# Patient Record
Sex: Female | Born: 1960 | Race: White | Hispanic: No
Health system: Southern US, Community
[De-identification: ages and names within clinical notes are randomized; demographics above are authoritative.]

## PROBLEM LIST (undated history)

## (undated) DIAGNOSIS — J449 Chronic obstructive pulmonary disease, unspecified: Secondary | ICD-10-CM

## (undated) DIAGNOSIS — J189 Pneumonia, unspecified organism: Secondary | ICD-10-CM

## (undated) DIAGNOSIS — R6520 Severe sepsis without septic shock: Secondary | ICD-10-CM

## (undated) DIAGNOSIS — J90 Pleural effusion, not elsewhere classified: Secondary | ICD-10-CM

## (undated) DIAGNOSIS — A419 Sepsis, unspecified organism: Secondary | ICD-10-CM

## (undated) DIAGNOSIS — J9621 Acute and chronic respiratory failure with hypoxia: Secondary | ICD-10-CM

---

## 2020-05-05 ENCOUNTER — Other Ambulatory Visit (HOSPITAL_COMMUNITY): Payer: Medicare Other

## 2020-05-05 ENCOUNTER — Inpatient Hospital Stay
Admission: RE | Admit: 2020-05-05 | Discharge: 2020-05-27 | Disposition: A | Discharge status: Skilled Nursing Facility | Payer: Medicare Other | Attending: Internal Medicine | Admitting: Internal Medicine

## 2020-05-05 DIAGNOSIS — J449 Chronic obstructive pulmonary disease, unspecified: Secondary | ICD-10-CM | POA: Diagnosis present

## 2020-05-05 DIAGNOSIS — A419 Sepsis, unspecified organism: Secondary | ICD-10-CM | POA: Diagnosis present

## 2020-05-05 DIAGNOSIS — J9600 Acute respiratory failure, unspecified whether with hypoxia or hypercapnia: Secondary | ICD-10-CM

## 2020-05-05 DIAGNOSIS — Z4659 Encounter for fitting and adjustment of other gastrointestinal appliance and device: Secondary | ICD-10-CM

## 2020-05-05 DIAGNOSIS — Z9889 Other specified postprocedural states: Secondary | ICD-10-CM

## 2020-05-05 DIAGNOSIS — J90 Pleural effusion, not elsewhere classified: Secondary | ICD-10-CM | POA: Diagnosis present

## 2020-05-05 DIAGNOSIS — J9621 Acute and chronic respiratory failure with hypoxia: Secondary | ICD-10-CM | POA: Diagnosis present

## 2020-05-05 DIAGNOSIS — N179 Acute kidney failure, unspecified: Secondary | ICD-10-CM

## 2020-05-05 DIAGNOSIS — J189 Pneumonia, unspecified organism: Secondary | ICD-10-CM

## 2020-05-05 HISTORY — DX: Sepsis, unspecified organism: A41.9

## 2020-05-05 HISTORY — DX: Chronic obstructive pulmonary disease, unspecified: J44.9

## 2020-05-05 HISTORY — DX: Acute and chronic respiratory failure with hypoxia: J96.21

## 2020-05-05 HISTORY — DX: Pneumonia, unspecified organism: J18.9

## 2020-05-05 HISTORY — DX: Severe sepsis without septic shock: R65.20

## 2020-05-05 HISTORY — DX: Pleural effusion, not elsewhere classified: J90

## 2020-05-05 LAB — BLOOD GAS, ARTERIAL
Acid-Base Excess: 5.4 mmol/L — ABNORMAL HIGH (ref 0.0–2.0)
Bicarbonate: 29.2 mmol/L — ABNORMAL HIGH (ref 20.0–28.0)
FIO2: 80
O2 Saturation: 90.5 %
Patient temperature: 36
pCO2 arterial: 39.6 mmHg (ref 32.0–48.0)
pH, Arterial: 7.476 — ABNORMAL HIGH (ref 7.350–7.450)
pO2, Arterial: 56.6 mmHg — ABNORMAL LOW (ref 83.0–108.0)

## 2020-05-05 LAB — VANCOMYCIN, TROUGH: Vancomycin Tr: 12 ug/mL — ABNORMAL LOW (ref 15–20)

## 2020-05-06 ENCOUNTER — Other Ambulatory Visit: Payer: Self-pay

## 2020-05-06 ENCOUNTER — Other Ambulatory Visit (HOSPITAL_COMMUNITY): Payer: Medicare Other

## 2020-05-06 HISTORY — PX: IR THORACENTESIS ASP PLEURAL SPACE W/IMG GUIDE: IMG5380

## 2020-05-06 LAB — BODY FLUID CELL COUNT WITH DIFFERENTIAL
Eos, Fluid: 0 %
Lymphs, Fluid: 40 %
Monocyte-Macrophage-Serous Fluid: 11 % — ABNORMAL LOW (ref 50–90)
Neutrophil Count, Fluid: 49 % — ABNORMAL HIGH (ref 0–25)
Total Nucleated Cell Count, Fluid: 105 cu mm (ref 0–1000)

## 2020-05-06 LAB — CBC WITH DIFFERENTIAL/PLATELET
Abs Immature Granulocytes: 0.11 10*3/uL — ABNORMAL HIGH (ref 0.00–0.07)
Basophils Absolute: 0 10*3/uL (ref 0.0–0.1)
Basophils Relative: 0 %
Eosinophils Absolute: 0.1 10*3/uL (ref 0.0–0.5)
Eosinophils Relative: 1 %
HCT: 24.4 % — ABNORMAL LOW (ref 36.0–46.0)
Hemoglobin: 7.4 g/dL — ABNORMAL LOW (ref 12.0–15.0)
Immature Granulocytes: 1 %
Lymphocytes Relative: 9 %
Lymphs Abs: 0.7 10*3/uL (ref 0.7–4.0)
MCH: 26.6 pg (ref 26.0–34.0)
MCHC: 30.3 g/dL (ref 30.0–36.0)
MCV: 87.8 fL (ref 80.0–100.0)
Monocytes Absolute: 0.4 10*3/uL (ref 0.1–1.0)
Monocytes Relative: 5 %
Neutro Abs: 6.9 10*3/uL (ref 1.7–7.7)
Neutrophils Relative %: 84 %
Platelets: 94 10*3/uL — ABNORMAL LOW (ref 150–400)
RBC: 2.78 MIL/uL — ABNORMAL LOW (ref 3.87–5.11)
RDW: 21.2 % — ABNORMAL HIGH (ref 11.5–15.5)
WBC: 8.2 10*3/uL (ref 4.0–10.5)
nRBC: 0 % (ref 0.0–0.2)

## 2020-05-06 LAB — COMPREHENSIVE METABOLIC PANEL
ALT: 106 U/L — ABNORMAL HIGH (ref 0–44)
AST: 75 U/L — ABNORMAL HIGH (ref 15–41)
Albumin: 1.6 g/dL — ABNORMAL LOW (ref 3.5–5.0)
Alkaline Phosphatase: 85 U/L (ref 38–126)
Anion gap: 10 (ref 5–15)
BUN: 74 mg/dL — ABNORMAL HIGH (ref 8–23)
CO2: 29 mmol/L (ref 22–32)
Calcium: 7.8 mg/dL — ABNORMAL LOW (ref 8.9–10.3)
Chloride: 112 mmol/L — ABNORMAL HIGH (ref 98–111)
Creatinine, Ser: 1.3 mg/dL — ABNORMAL HIGH (ref 0.44–1.00)
GFR calc Af Amer: 51 mL/min — ABNORMAL LOW (ref >60)
GFR calc non Af Amer: 44 mL/min — ABNORMAL LOW (ref >60)
Glucose, Bld: 96 mg/dL (ref 70–99)
Potassium: 3.7 mmol/L (ref 3.5–5.1)
Sodium: 151 mmol/L — ABNORMAL HIGH (ref 135–145)
Total Bilirubin: 0.7 mg/dL (ref 0.3–1.2)
Total Protein: 4.7 g/dL — ABNORMAL LOW (ref 6.5–8.1)

## 2020-05-06 LAB — GLUCOSE, PLEURAL OR PERITONEAL FLUID: Glucose, Fluid: 110 mg/dL

## 2020-05-06 LAB — LACTATE DEHYDROGENASE, PLEURAL OR PERITONEAL FLUID: LD, Fluid: 109 U/L — ABNORMAL HIGH (ref 3–23)

## 2020-05-06 LAB — PROTIME-INR
INR: 1.1 (ref 0.8–1.2)
Prothrombin Time: 13.9 seconds (ref 11.4–15.2)

## 2020-05-06 LAB — GRAM STAIN

## 2020-05-06 LAB — HEMOGLOBIN A1C
Hgb A1c MFr Bld: 6.6 % — ABNORMAL HIGH (ref 4.8–5.6)
Mean Plasma Glucose: 142.72 mg/dL

## 2020-05-06 LAB — PHOSPHORUS: Phosphorus: 4.9 mg/dL — ABNORMAL HIGH (ref 2.5–4.6)

## 2020-05-06 LAB — PROTEIN, PLEURAL OR PERITONEAL FLUID: Total protein, fluid: <3 g/dL

## 2020-05-06 LAB — MAGNESIUM: Magnesium: 2.2 mg/dL (ref 1.7–2.4)

## 2020-05-06 MED ORDER — LIDOCAINE HCL 1 % IJ SOLN
INTRAMUSCULAR | Status: AC
  Filled 2020-05-06: qty 20

## 2020-05-06 NOTE — Procedures (Signed)
PROCEDURE SUMMARY:  Successful US guided right thoracentesis. Yielded 550 mL of clear yellow fluid. Patient tolerated procedure well. No immediate complications. EBL = trace  Specimen was sent for labs.  Post procedure chest X-ray pending  Mariko Nowakowski S Mackensey Bolte PA-C 05/06/2020 11:54 AM

## 2020-05-07 LAB — BASIC METABOLIC PANEL
Anion gap: 12 (ref 5–15)
BUN: 62 mg/dL — ABNORMAL HIGH (ref 8–23)
CO2: 27 mmol/L (ref 22–32)
Calcium: 7.8 mg/dL — ABNORMAL LOW (ref 8.9–10.3)
Chloride: 112 mmol/L — ABNORMAL HIGH (ref 98–111)
Creatinine, Ser: 1.27 mg/dL — ABNORMAL HIGH (ref 0.44–1.00)
GFR calc Af Amer: 53 mL/min — ABNORMAL LOW (ref >60)
GFR calc non Af Amer: 46 mL/min — ABNORMAL LOW (ref >60)
Glucose, Bld: 135 mg/dL — ABNORMAL HIGH (ref 70–99)
Potassium: 3.8 mmol/L (ref 3.5–5.1)
Sodium: 151 mmol/L — ABNORMAL HIGH (ref 135–145)

## 2020-05-07 LAB — PH, BODY FLUID: pH, Body Fluid: 7.9

## 2020-05-08 DIAGNOSIS — J9621 Acute and chronic respiratory failure with hypoxia: Secondary | ICD-10-CM

## 2020-05-08 DIAGNOSIS — A419 Sepsis, unspecified organism: Secondary | ICD-10-CM

## 2020-05-08 DIAGNOSIS — J90 Pleural effusion, not elsewhere classified: Secondary | ICD-10-CM | POA: Diagnosis not present

## 2020-05-08 DIAGNOSIS — R6520 Severe sepsis without septic shock: Secondary | ICD-10-CM

## 2020-05-08 DIAGNOSIS — J449 Chronic obstructive pulmonary disease, unspecified: Secondary | ICD-10-CM

## 2020-05-08 DIAGNOSIS — J189 Pneumonia, unspecified organism: Secondary | ICD-10-CM

## 2020-05-08 LAB — CBC
HCT: 21.2 % — ABNORMAL LOW (ref 36.0–46.0)
Hemoglobin: 6.3 g/dL — CL (ref 12.0–15.0)
MCH: 26.5 pg (ref 26.0–34.0)
MCHC: 29.7 g/dL — ABNORMAL LOW (ref 30.0–36.0)
MCV: 89.1 fL (ref 80.0–100.0)
Platelets: 82 10*3/uL — ABNORMAL LOW (ref 150–400)
RBC: 2.38 MIL/uL — ABNORMAL LOW (ref 3.87–5.11)
RDW: 21.5 % — ABNORMAL HIGH (ref 11.5–15.5)
WBC: 6.8 10*3/uL (ref 4.0–10.5)
nRBC: 0 % (ref 0.0–0.2)

## 2020-05-08 LAB — BLOOD GAS, ARTERIAL
Acid-Base Excess: 5.6 mmol/L — ABNORMAL HIGH (ref 0.0–2.0)
Bicarbonate: 28.9 mmol/L — ABNORMAL HIGH (ref 20.0–28.0)
FIO2: 75
O2 Saturation: 97.1 %
Patient temperature: 37.3
pCO2 arterial: 37.6 mmHg (ref 32.0–48.0)
pH, Arterial: 7.499 — ABNORMAL HIGH (ref 7.350–7.450)
pO2, Arterial: 82.8 mmHg — ABNORMAL LOW (ref 83.0–108.0)

## 2020-05-08 LAB — BASIC METABOLIC PANEL
Anion gap: 11 (ref 5–15)
BUN: 59 mg/dL — ABNORMAL HIGH (ref 8–23)
CO2: 27 mmol/L (ref 22–32)
Calcium: 8 mg/dL — ABNORMAL LOW (ref 8.9–10.3)
Chloride: 110 mmol/L (ref 98–111)
Creatinine, Ser: 1.39 mg/dL — ABNORMAL HIGH (ref 0.44–1.00)
GFR calc Af Amer: 47 mL/min — ABNORMAL LOW (ref >60)
GFR calc non Af Amer: 41 mL/min — ABNORMAL LOW (ref >60)
Glucose, Bld: 300 mg/dL — ABNORMAL HIGH (ref 70–99)
Potassium: 2.9 mmol/L — ABNORMAL LOW (ref 3.5–5.1)
Sodium: 148 mmol/L — ABNORMAL HIGH (ref 135–145)

## 2020-05-08 LAB — MAGNESIUM: Magnesium: 2.2 mg/dL (ref 1.7–2.4)

## 2020-05-08 LAB — ABO/RH: ABO/RH(D): AB POS

## 2020-05-08 LAB — PREPARE RBC (CROSSMATCH)

## 2020-05-08 NOTE — Consult Note (Signed)
Pulmonary Critical Care Medicine [facility] [place]  PULMONARY SERVICE  Date of Service: 05/08/2020  PULMONARY CRITICAL CARE Katherine Hampton  CXK:[id number]  DOB: 1961/08/24   DOA: 05/05/2020  Referring Physician: Carron Curie, MD  HPI: Katherine Hampton is a 59 y.o. female seen for follow up of Acute on Chronic Respiratory Failure.  Patient has multiple medical problems presented to the hospital with nausea and vomiting patient also was noted to be febrile at that time.  Diagnosed with sepsis secondary to osteomyelitis.  Patient however decompensated developed respiratory failure also developed multifocal pneumonia ended up on BiPAP and she was essentially BiPAP dependent for 3 weeks.  The patient was found to have MRSA bacteremia and was treated with IV antibiotics and is supposed to continue with the antibiotics through 13 August.  Other complications included development of acute renal failure secondary to fluid issues.  Patient also has been somewhat encephalopathic also been having delirium.  She currently is on high flow oxygen has been on 15 L with an FiO2 of 55%.  Review of Systems:  ROS performed and is unremarkable other than noted above.  Past Medical History:  Diagnosis Date  . Anxiety  . Arthritis  rheumatoid  . Chronic back pain  goes to pain clinic  . COPD (chronic obstructive pulmonary disease) (*)  . Diabetes mellitus (*)  . Hypertension  . Rheumatoid arthritis(714.0) (*)   Past Surgical History:  Procedure Laterality Date  . Carpal tunnel release  . Cholecystectomy  . Foot surgery  . Heel spur excision  . Spine surgery  lumbar  . Toe amputation 10/14/2016  . Tonsillectomy   Social History   Socioeconomic History  . Marital status: Widowed  Spouse name: Not on file  . Number of children: Not on file  . Years of education: Not on file  . Highest education level: Not on file  Occupational History  . Not on file  Tobacco Use  . Smoking  status: Current Every Day Smoker  Packs/day: 0.50  Types: Cigarettes  . Smokeless tobacco: Never Used   Family History  Problem Relation Age of Onset  . Heart disease Father  . Diabetes Father   Allergies  Allergen Reactions  . Cashew Anaphylaxis  . Other Itching and Cough  PET DANDER  . Penicillins Anaphylaxis  . Morphine Nausea And Vomiting  . Sulfa Antibiotics Nausea And Vomiting    Medications: Reviewed on Rounds  Physical Exam:  Vitals: Temperature is 97.1 pulse 78 respiratory 34 blood pressure is 145/76 saturations 93%  Ventilator Settings on heated high flow oxygen  . General: Comfortable at this time . Eyes: Grossly normal lids, irises & conjunctiva . ENT: grossly tongue is normal . Neck: no obvious mass . Cardiovascular: S1-S2 normal no gallop rub . Respiratory: No rhonchi very coarse breath sounds . Abdomen: Soft and nontender . Skin: no rash seen on limited exam . Musculoskeletal: not rigid . Psychiatric:unable to assess . Neurologic: no seizure no involuntary movements         Labs on Admission:  Basic Metabolic Panel: Recent Labs  Lab 05/06/20 0635 05/07/20 0549 05/08/20 1215  NA 151* 151* 148*  K 3.7 3.8 2.9*  CL 112* 112* 110  CO2 29 27 27   GLUCOSE 96 135* 300*  BUN 74* 62* 59*  CREATININE 1.30* 1.27* 1.39*  CALCIUM 7.8* 7.8* 8.0*  MG 2.2  --  2.2  PHOS 4.9*  --   --     Recent Labs  Lab 05/05/20 1738 05/08/20 0951  PHART 7.476* 7.499*  PCO2ART 39.6 37.6  PO2ART 56.6* 82.8*  HCO3 29.2* 28.9*  O2SAT 90.5 97.1    Liver Function Tests: Recent Labs  Lab 05/06/20 0635  AST 75*  ALT 106*  ALKPHOS 85  BILITOT 0.7  PROT 4.7*  ALBUMIN 1.6*   No results for input(s): LIPASE, AMYLASE in the last 168 hours. No results for input(s): AMMONIA in the last 168 hours.  CBC: Recent Labs  Lab 05/06/20 0635 05/08/20 1215  WBC 8.2 6.8  NEUTROABS 6.9  --   HGB 7.4* 6.3*  HCT 24.4* 21.2*  MCV 87.8 89.1  PLT 94* 82*    Cardiac  Enzymes: No results for input(s): CKTOTAL, CKMB, CKMBINDEX, TROPONINI in the last 168 hours.  BNP (last 3 results) No results for input(s): BNP in the last 8760 hours.  ProBNP (last 3 results) No results for input(s): PROBNP in the last 8760 hours.   Radiological Exams on Admission: DG Chest 1 View  Result Date: 05/06/2020 CLINICAL DATA:  Status post right thoracentesis. EXAM: CHEST  1 VIEW COMPARISON:  05/06/2020. FINDINGS: NG tube noted with tip below left hemidiaphragm. Stable cardiomegaly. No pulmonary venous congestion. Low lung volumes with bibasilar atelectasis/infiltrates. Small left pleural effusion. No pneumothorax. IMPRESSION: 1. NG tube noted with tip below left hemidiaphragm. 2. Cardiomegaly. No pulmonary venous congestion. 3. Low lung volumes with bibasilar atelectasis/infiltrates. Small left pleural effusion. No pneumothorax. Electronically Signed   By: Maisie Fus  Register   On: 05/06/2020 12:35   DG CHEST PORT 1 VIEW  Result Date: 05/06/2020 CLINICAL DATA:  Respiratory failure EXAM: PORTABLE CHEST 1 VIEW COMPARISON:  None. FINDINGS: Central catheter tip is in the superior vena cava. Nasogastric tube tip and side port are below the diaphragm. No pneumothorax. There is an apparent right pleural effusion. There is atelectatic change in the right base, primarily medially. The left lung is clear. There is cardiomegaly with pulmonary vascularity normal. No adenopathy. No bone lesions. IMPRESSION: Tube and catheter positions as described without pneumothorax. Right pleural effusion with right base atelectasis. A degree of pneumonia in the right base superimposed is difficult to exclude. Left lung appears clear. There is cardiomegaly. Electronically Signed   By: Bretta Bang III M.D.   On: 05/06/2020 05:53   DG Abd Portable 1V  Result Date: 05/05/2020 CLINICAL DATA:  NG tube placement. EXAM: PORTABLE ABDOMEN - 1 VIEW COMPARISON:  None. FINDINGS: Tip and side port of the enteric tube  below the diaphragm in the stomach. No bowel dilatation in the visualized abdomen. Opacification of lower right hemithorax is likely related to elevated right hemidiaphragm. IMPRESSION: Tip and side port of the enteric tube below the diaphragm in the stomach. Electronically Signed   By: Narda Rutherford M.D.   On: 05/05/2020 18:08   IR THORACENTESIS ASP PLEURAL SPACE W/IMG GUIDE  Result Date: 05/06/2020 INDICATION: Fluid overload right pleural effusion. Request for diagnostic and therapeutic thoracentesis. EXAM: ULTRASOUND GUIDED RIGHT THORACENTESIS MEDICATIONS: 1% lidocaine 10 mL COMPLICATIONS: None immediate. PROCEDURE: An ultrasound guided thoracentesis was thoroughly discussed with the patient and questions answered. The benefits, risks, alternatives and complications were also discussed. The patient understands and wishes to proceed with the procedure. Written consent was obtained. Ultrasound was performed to localize and mark an adequate pocket of fluid in the right chest. The area was then prepped and draped in the normal sterile fashion. 1% Lidocaine was used for local anesthesia. Under ultrasound guidance a 6 Fr Safe-T-Centesis catheter was  introduced. Thoracentesis was performed. The catheter was removed and a dressing applied. FINDINGS: A total of approximately 550 mL of clear light yellow fluid was removed. Samples were sent to the laboratory as requested by the clinical team. IMPRESSION: Successful ultrasound guided right thoracentesis yielding 550 mL of pleural fluid. No pneumothorax on post-procedure chest x-ray. Read by: Corrin Parker, PA-C Electronically Signed   By: Gilmer Mor D.O.   On: 05/06/2020 13:56    Assessment/Plan Active Problems:   Acute on chronic respiratory failure with hypoxia (HCC)   COPD, severe (HCC)   Multifocal pneumonia   Severe sepsis (HCC)   Pleural effusion   1. Acute on chronic respiratory failure hypoxia patient right now is still on heated high flow  oxygen.  Patient has been on higher levels however had a thoracentesis done on the 29th with removal of 550 cc of fluid will see about weaning FiO2 down further if possible.  We will continue with supportive care follow-up x-ray revealed no pneumothorax 2. Severe COPD patient will continue with medical management nebulizers as necessary. 3. Multifocal pneumonia patient has been treated with antibiotics at been cultured with MRSA bacteremia also. 4. Severe sepsis has been treated with antibiotics supposed to complete antibiotics on August 13 is felt to be secondary to the osteomyelitis 5. Pleural effusion status post thoracentesis had removal of 550 cc of fluid we will continue to monitor for reoccurrence.  I have personally seen and evaluated the patient, evaluated laboratory and imaging results, formulated the assessment and plan and placed orders. The Patient requires high complexity decision making with multiple systems involvement.  Case was discussed on Rounds with the Respiratory Therapy Director and the Respiratory staff Time Spent  Yevonne Pax, MD [facility] Pulmonary Critical Care Medicine Sleep Medicine

## 2020-05-09 ENCOUNTER — Other Ambulatory Visit (HOSPITAL_COMMUNITY): Payer: Medicare Other

## 2020-05-09 DIAGNOSIS — J449 Chronic obstructive pulmonary disease, unspecified: Secondary | ICD-10-CM | POA: Diagnosis not present

## 2020-05-09 DIAGNOSIS — J9621 Acute and chronic respiratory failure with hypoxia: Secondary | ICD-10-CM | POA: Diagnosis not present

## 2020-05-09 DIAGNOSIS — J189 Pneumonia, unspecified organism: Secondary | ICD-10-CM | POA: Diagnosis not present

## 2020-05-09 DIAGNOSIS — J90 Pleural effusion, not elsewhere classified: Secondary | ICD-10-CM | POA: Diagnosis not present

## 2020-05-09 LAB — CBC
HCT: 24.5 % — ABNORMAL LOW (ref 36.0–46.0)
Hemoglobin: 7.5 g/dL — ABNORMAL LOW (ref 12.0–15.0)
MCH: 27.2 pg (ref 26.0–34.0)
MCHC: 30.6 g/dL (ref 30.0–36.0)
MCV: 88.8 fL (ref 80.0–100.0)
Platelets: 79 10*3/uL — ABNORMAL LOW (ref 150–400)
RBC: 2.76 MIL/uL — ABNORMAL LOW (ref 3.87–5.11)
RDW: 21.2 % — ABNORMAL HIGH (ref 11.5–15.5)
WBC: 9.1 10*3/uL (ref 4.0–10.5)
nRBC: 0.3 % — ABNORMAL HIGH (ref 0.0–0.2)

## 2020-05-09 LAB — BASIC METABOLIC PANEL
Anion gap: 12 (ref 5–15)
BUN: 57 mg/dL — ABNORMAL HIGH (ref 8–23)
CO2: 27 mmol/L (ref 22–32)
Calcium: 8.4 mg/dL — ABNORMAL LOW (ref 8.9–10.3)
Chloride: 110 mmol/L (ref 98–111)
Creatinine, Ser: 1.45 mg/dL — ABNORMAL HIGH (ref 0.44–1.00)
GFR calc Af Amer: 45 mL/min — ABNORMAL LOW (ref >60)
GFR calc non Af Amer: 39 mL/min — ABNORMAL LOW (ref >60)
Glucose, Bld: 229 mg/dL — ABNORMAL HIGH (ref 70–99)
Potassium: 3.3 mmol/L — ABNORMAL LOW (ref 3.5–5.1)
Sodium: 149 mmol/L — ABNORMAL HIGH (ref 135–145)

## 2020-05-09 NOTE — Progress Notes (Signed)
Pulmonary Critical Care Medicine [facility] [place]   PULMONARY CRITICAL CARE SERVICE  PROGRESS NOTE  Date of Service: 05/09/2020  Katherine Hampton  [id number]  DOB: [birth year]   DOA: 05/05/2020  Referring Physician: Carron Curie, MD  HPI: Katherine Hampton is a 59 y.o. female seen for follow up of Acute on Chronic Respiratory Failure.  Patient at this time is on high flow nasal cannula has been on 20 L right now requiring 55% FiO2 good saturations are noted.  Medications: Reviewed on Rounds  Physical Exam:  Vitals: Temperature is 98.4 pulse 72 respiratory 25 blood pressure is 138/67 saturations 95%  Ventilator Settings on high flow nasal cannula 20 L with an FiO2 of 55%  . General: Comfortable at this time . Eyes: Grossly normal lids, irises & conjunctiva . ENT: grossly tongue is normal . Neck: no obvious mass . Cardiovascular: S1 S2 normal no gallop . Respiratory: Scattered rhonchi coarse breath sounds are noted at this time . Abdomen: soft . Skin: no rash seen on limited exam . Musculoskeletal: not rigid . Psychiatric:unable to assess . Neurologic: no seizure no involuntary movements         Lab Data:   Basic Metabolic Panel: Recent Labs  Lab 05/06/20 0635 05/07/20 0549 05/08/20 1215 05/09/20 0800  NA 151* 151* 148* 149*  K 3.7 3.8 2.9* 3.3*  CL 112* 112* 110 110  CO2 29 27 27 27   GLUCOSE 96 135* 300* 229*  BUN 74* 62* 59* 57*  CREATININE 1.30* 1.27* 1.39* 1.45*  CALCIUM 7.8* 7.8* 8.0* 8.4*  MG 2.2  --  2.2  --   PHOS 4.9*  --   --   --     ABG: Recent Labs  Lab 05/05/20 1738 05/08/20 0951  PHART 7.476* 7.499*  PCO2ART 39.6 37.6  PO2ART 56.6* 82.8*  HCO3 29.2* 28.9*  O2SAT 90.5 97.1    Liver Function Tests: Recent Labs  Lab 05/06/20 0635  AST 75*  ALT 106*  ALKPHOS 85  BILITOT 0.7  PROT 4.7*  ALBUMIN 1.6*   No results for input(s): LIPASE, AMYLASE in the last 168 hours. No results for input(s): AMMONIA in the last 168  hours.  CBC: Recent Labs  Lab 05/06/20 0635 05/08/20 1215 05/09/20 0800  WBC 8.2 6.8 9.1  NEUTROABS 6.9  --   --   HGB 7.4* 6.3* 7.5*  HCT 24.4* 21.2* 24.5*  MCV 87.8 89.1 88.8  PLT 94* 82* 79*    Cardiac Enzymes: No results for input(s): CKTOTAL, CKMB, CKMBINDEX, TROPONINI in the last 168 hours.  BNP (last 3 results) No results for input(s): BNP in the last 8760 hours.  ProBNP (last 3 results) No results for input(s): PROBNP in the last 8760 hours.  Radiological Exams: No results found.  Assessment/Plan Active Problems:   Acute on chronic respiratory failure with hypoxia (HCC)   COPD, severe (HCC)   Multifocal pneumonia   Severe sepsis (HCC)   Pleural effusion   1. Acute on chronic respiratory failure hypoxia patient continues on high flow oxygen right now flow rates are down FiO2 is down spoke with respiratory therapy during rounds to try to wean the FiO2 down and try an Oxymizer possibly.  Patient's hemoglobin looks better than it was yesterday so perhaps this will make it easier to try to wean FiO2. 2. Severe COPD medical management we will continue with supportive care. 3. Multifocal pneumonia antibiotics as ordered. 4. Severe sepsis hemodynamics are stable 5. Pleural effusion status post thoracentesis  I have personally seen and evaluated the patient, evaluated laboratory and imaging results, formulated the assessment and plan and placed orders. The Patient requires high complexity decision making with multiple systems involvement.  Rounds were done with the Respiratory Therapy Director and Staff therapists and discussed with nursing staff also.  Allyne Gee, MD [facility] Pulmonary Critical Care Medicine Sleep Medicine

## 2020-05-10 ENCOUNTER — Encounter: Payer: Self-pay | Admitting: Internal Medicine

## 2020-05-10 DIAGNOSIS — J449 Chronic obstructive pulmonary disease, unspecified: Secondary | ICD-10-CM | POA: Diagnosis not present

## 2020-05-10 DIAGNOSIS — J189 Pneumonia, unspecified organism: Secondary | ICD-10-CM | POA: Diagnosis not present

## 2020-05-10 DIAGNOSIS — J9621 Acute and chronic respiratory failure with hypoxia: Secondary | ICD-10-CM | POA: Diagnosis present

## 2020-05-10 DIAGNOSIS — A419 Sepsis, unspecified organism: Secondary | ICD-10-CM | POA: Diagnosis present

## 2020-05-10 DIAGNOSIS — J90 Pleural effusion, not elsewhere classified: Secondary | ICD-10-CM | POA: Diagnosis present

## 2020-05-10 LAB — BASIC METABOLIC PANEL
Anion gap: 10 (ref 5–15)
BUN: 50 mg/dL — ABNORMAL HIGH (ref 8–23)
CO2: 26 mmol/L (ref 22–32)
Calcium: 7.7 mg/dL — ABNORMAL LOW (ref 8.9–10.3)
Chloride: 105 mmol/L (ref 98–111)
Creatinine, Ser: 1.27 mg/dL — ABNORMAL HIGH (ref 0.44–1.00)
GFR calc Af Amer: 53 mL/min — ABNORMAL LOW (ref >60)
GFR calc non Af Amer: 46 mL/min — ABNORMAL LOW (ref >60)
Glucose, Bld: 343 mg/dL — ABNORMAL HIGH (ref 70–99)
Potassium: 3.4 mmol/L — ABNORMAL LOW (ref 3.5–5.1)
Sodium: 141 mmol/L (ref 135–145)

## 2020-05-10 LAB — RENAL FUNCTION PANEL
Albumin: 2 g/dL — ABNORMAL LOW (ref 3.5–5.0)
Anion gap: 11 (ref 5–15)
BUN: 56 mg/dL — ABNORMAL HIGH (ref 8–23)
CO2: 26 mmol/L (ref 22–32)
Calcium: 8 mg/dL — ABNORMAL LOW (ref 8.9–10.3)
Chloride: 109 mmol/L (ref 98–111)
Creatinine, Ser: 1.33 mg/dL — ABNORMAL HIGH (ref 0.44–1.00)
GFR calc Af Amer: 50 mL/min — ABNORMAL LOW (ref >60)
GFR calc non Af Amer: 43 mL/min — ABNORMAL LOW (ref >60)
Glucose, Bld: 186 mg/dL — ABNORMAL HIGH (ref 70–99)
Phosphorus: 3.4 mg/dL (ref 2.5–4.6)
Potassium: 4.2 mmol/L (ref 3.5–5.1)
Sodium: 146 mmol/L — ABNORMAL HIGH (ref 135–145)

## 2020-05-10 LAB — VANCOMYCIN, TROUGH: Vancomycin Tr: 30 ug/mL (ref 15–20)

## 2020-05-10 LAB — CYTOLOGY - NON PAP

## 2020-05-10 NOTE — Progress Notes (Signed)
Pulmonary Critical Care Medicine [facility] [place]   PULMONARY CRITICAL CARE SERVICE  PROGRESS NOTE  Date of Service: 05/10/2020  Katherine Hampton  WNU:[id number]  DOB: 04/27/1961   DOA: 05/05/2020  Referring Physician: Carron Curie, MD  HPI: Katherine Hampton is a 59 y.o. female seen for follow up of Acute on Chronic Respiratory Failure.  Patient is on high flow nasal cannula right now is on 15 L FiO2 is at 55% we will try to decrease the FiO2 down.  Secretions are minimal right now  Medications: Reviewed on Rounds  Physical Exam:  Vitals: Temperature is 98.1 pulse 66 respiratory 23 blood pressure is 126/59 saturations 96%  Ventilator Settings on high flow nasal cannula 15 L FiO2 55%  . General: Comfortable at this time . Eyes: Grossly normal lids, irises & conjunctiva . ENT: grossly tongue is normal . Neck: no obvious mass . Cardiovascular: S1 S2 normal no gallop . Respiratory: No rhonchi coarse breath sounds . Abdomen: soft . Skin: no rash seen on limited exam . Musculoskeletal: not rigid . Psychiatric:unable to assess . Neurologic: no seizure no involuntary movements         Lab Data:   Basic Metabolic Panel: Recent Labs  Lab 05/06/20 0635 05/07/20 0549 05/08/20 1215 05/09/20 0800 05/10/20 0437  NA 151* 151* 148* 149* 146*  K 3.7 3.8 2.9* 3.3* 4.2  CL 112* 112* 110 110 109  CO2 29 27 27 27 26   GLUCOSE 96 135* 300* 229* 186*  BUN 74* 62* 59* 57* 56*  CREATININE 1.30* 1.27* 1.39* 1.45* 1.33*  CALCIUM 7.8* 7.8* 8.0* 8.4* 8.0*  MG 2.2  --  2.2  --   --   PHOS 4.9*  --   --   --  3.4    ABG: Recent Labs  Lab 05/05/20 1738 05/08/20 0951  PHART 7.476* 7.499*  PCO2ART 39.6 37.6  PO2ART 56.6* 82.8*  HCO3 29.2* 28.9*  O2SAT 90.5 97.1    Liver Function Tests: Recent Labs  Lab 05/06/20 0635 05/10/20 0437  AST 75*  --   ALT 106*  --   ALKPHOS 85  --   BILITOT 0.7  --   PROT 4.7*  --   ALBUMIN 1.6* 2.0*   No results for input(s): LIPASE, AMYLASE  in the last 168 hours. No results for input(s): AMMONIA in the last 168 hours.  CBC: Recent Labs  Lab 05/06/20 0635 05/08/20 1215 05/09/20 0800  WBC 8.2 6.8 9.1  NEUTROABS 6.9  --   --   HGB 7.4* 6.3* 7.5*  HCT 24.4* 21.2* 24.5*  MCV 87.8 89.1 88.8  PLT 94* 82* 79*    Cardiac Enzymes: No results for input(s): CKTOTAL, CKMB, CKMBINDEX, TROPONINI in the last 168 hours.  BNP (last 3 results) No results for input(s): BNP in the last 8760 hours.  ProBNP (last 3 results) No results for input(s): PROBNP in the last 8760 hours.  Radiological Exams: 07/09/20 RENAL  Result Date: 05/09/2020 CLINICAL DATA:  Acute kidney injury EXAM: RENAL / URINARY TRACT ULTRASOUND COMPLETE COMPARISON:  None. FINDINGS: Right Kidney: Renal measurements: Not visualized Left Kidney: Renal measurements: 10.2 x 5.7 x 5.9 cm = volume: 178 mL. Echogenicity within normal limits. No mass or hydronephrosis visualized. Bladder: Decompressed by Foley catheter. Other: Small volume ascites within RIGHT lower quadrant, LEFT lower quadrant and midline pelvis. IMPRESSION: 1. LEFT kidney appears normal. 2. RIGHT kidney is not visualized. 3. Small volume ascites. 4. Bladder decompressed by Foley catheter. Electronically Signed  By: Bary Richard M.D.   On: 05/09/2020 13:32    Assessment/Plan Active Problems:   Acute on chronic respiratory failure with hypoxia (HCC)   COPD, severe (HCC)   Multifocal pneumonia   Severe sepsis (HCC)   Pleural effusion   1. Acute on chronic respiratory failure hypoxia patient right now will be weaned down FiO2.  Patient will continue with secretion management supportive care. 2. Multifocal pneumonia treated with antibiotics we will continue to follow 3. Severe COPD medical management nebulizers as necessary 4. Severe sepsis hemodynamics are stable 5. Pleural effusion status post thoracentesis   I have personally seen and evaluated the patient, evaluated laboratory and imaging results,  formulated the assessment and plan and placed orders. The Patient requires high complexity decision making with multiple systems involvement.  Rounds were done with the Respiratory Therapy Director and Staff therapists and discussed with nursing staff also.  Yevonne Pax, MD [facility] Pulmonary Critical Care Medicine Sleep Medicine

## 2020-05-11 ENCOUNTER — Other Ambulatory Visit (HOSPITAL_COMMUNITY): Payer: Medicare Other

## 2020-05-11 DIAGNOSIS — J9621 Acute and chronic respiratory failure with hypoxia: Secondary | ICD-10-CM | POA: Diagnosis not present

## 2020-05-11 DIAGNOSIS — J90 Pleural effusion, not elsewhere classified: Secondary | ICD-10-CM | POA: Diagnosis not present

## 2020-05-11 DIAGNOSIS — J449 Chronic obstructive pulmonary disease, unspecified: Secondary | ICD-10-CM | POA: Diagnosis not present

## 2020-05-11 DIAGNOSIS — J189 Pneumonia, unspecified organism: Secondary | ICD-10-CM | POA: Diagnosis not present

## 2020-05-11 LAB — CULTURE, BLOOD (ROUTINE X 2)
Culture: NO GROWTH
Culture: NO GROWTH
Special Requests: ADEQUATE

## 2020-05-11 LAB — RENAL FUNCTION PANEL
Albumin: 1.8 g/dL — ABNORMAL LOW (ref 3.5–5.0)
Anion gap: 7 (ref 5–15)
BUN: 52 mg/dL — ABNORMAL HIGH (ref 8–23)
CO2: 28 mmol/L (ref 22–32)
Calcium: 7.9 mg/dL — ABNORMAL LOW (ref 8.9–10.3)
Chloride: 108 mmol/L (ref 98–111)
Creatinine, Ser: 1.21 mg/dL — ABNORMAL HIGH (ref 0.44–1.00)
GFR calc Af Amer: 56 mL/min — ABNORMAL LOW (ref >60)
GFR calc non Af Amer: 48 mL/min — ABNORMAL LOW (ref >60)
Glucose, Bld: 179 mg/dL — ABNORMAL HIGH (ref 70–99)
Phosphorus: 3.7 mg/dL (ref 2.5–4.6)
Potassium: 3.3 mmol/L — ABNORMAL LOW (ref 3.5–5.1)
Sodium: 143 mmol/L (ref 135–145)

## 2020-05-11 LAB — TYPE AND SCREEN
ABO/RH(D): AB POS
Antibody Screen: NEGATIVE

## 2020-05-11 LAB — CBC
HCT: 22.5 % — ABNORMAL LOW (ref 36.0–46.0)
Hemoglobin: 6.8 g/dL — CL (ref 12.0–15.0)
MCH: 27.3 pg (ref 26.0–34.0)
MCHC: 30.2 g/dL (ref 30.0–36.0)
MCV: 90.4 fL (ref 80.0–100.0)
Platelets: 55 10*3/uL — ABNORMAL LOW (ref 150–400)
RBC: 2.49 MIL/uL — ABNORMAL LOW (ref 3.87–5.11)
RDW: 21 % — ABNORMAL HIGH (ref 11.5–15.5)
WBC: 6.4 10*3/uL (ref 4.0–10.5)
nRBC: 0 % (ref 0.0–0.2)

## 2020-05-11 LAB — PREPARE RBC (CROSSMATCH)

## 2020-05-11 LAB — VANCOMYCIN, TROUGH
Vancomycin Tr: 32 ug/mL (ref 15–20)
Vancomycin Tr: 29 ug/mL (ref 15–20)

## 2020-05-11 LAB — MAGNESIUM: Magnesium: 1.9 mg/dL (ref 1.7–2.4)

## 2020-05-11 NOTE — Progress Notes (Signed)
Pulmonary Critical Care Medicine [facility] [place]   PULMONARY CRITICAL CARE SERVICE  PROGRESS NOTE  Date of Service: 05/11/2020  Katherine Hampton  RRN:[id number]  DOB: Mar 24, 1961   DOA: 05/05/2020  Referring Physician: Carron Curie, MD  HPI: Katherine Hampton is a 59 y.o. female seen for follow up of Acute on Chronic Respiratory Failure.  Patient is down to 6 L of Oxymizer today off of the high flow  Medications: Reviewed on Rounds  Physical Exam:  Vitals: Temperature is 97.6 pulse 68 respiratory 22 blood pressure is 118/54 saturations 99%  Ventilator Settings on 6 L Oxymizer  . General: Comfortable at this time . Eyes: Grossly normal lids, irises & conjunctiva . ENT: grossly tongue is normal . Neck: no obvious mass . Cardiovascular: S1 S2 normal no gallop . Respiratory: No rhonchi no rales are noted . Abdomen: soft . Skin: no rash seen on limited exam . Musculoskeletal: not rigid . Psychiatric:unable to assess . Neurologic: no seizure no involuntary movements         Lab Data:   Basic Metabolic Panel: Recent Labs  Lab 05/06/20 0635 05/07/20 0549 05/08/20 1215 05/09/20 0800 05/10/20 0437 05/10/20 1714 05/11/20 0630  NA 151*   < > 148* 149* 146* 141 143  K 3.7   < > 2.9* 3.3* 4.2 3.4* 3.3*  CL 112*   < > 110 110 109 105 108  CO2 29   < > 27 27 26 26 28   GLUCOSE 96   < > 300* 229* 186* 343* 179*  BUN 74*   < > 59* 57* 56* 50* 52*  CREATININE 1.30*   < > 1.39* 1.45* 1.33* 1.27* 1.21*  CALCIUM 7.8*   < > 8.0* 8.4* 8.0* 7.7* 7.9*  MG 2.2  --  2.2  --   --   --  1.9  PHOS 4.9*  --   --   --  3.4  --  3.7   < > = values in this interval not displayed.    ABG: Recent Labs  Lab 05/05/20 1738 05/08/20 0951  PHART 7.476* 7.499*  PCO2ART 39.6 37.6  PO2ART 56.6* 82.8*  HCO3 29.2* 28.9*  O2SAT 90.5 97.1    Liver Function Tests: Recent Labs  Lab 05/06/20 0635 05/10/20 0437 05/11/20 0630  AST 75*  --   --   ALT 106*  --   --   ALKPHOS 85  --   --    BILITOT 0.7  --   --   PROT 4.7*  --   --   ALBUMIN 1.6* 2.0* 1.8*   No results for input(s): LIPASE, AMYLASE in the last 168 hours. No results for input(s): AMMONIA in the last 168 hours.  CBC: Recent Labs  Lab 05/06/20 0635 05/08/20 1215 05/09/20 0800 05/11/20 0630  WBC 8.2 6.8 9.1 6.4  NEUTROABS 6.9  --   --   --   HGB 7.4* 6.3* 7.5* 6.8*  HCT 24.4* 21.2* 24.5* 22.5*  MCV 87.8 89.1 88.8 90.4  PLT 94* 82* 79* 55*    Cardiac Enzymes: No results for input(s): CKTOTAL, CKMB, CKMBINDEX, TROPONINI in the last 168 hours.  BNP (last 3 results) No results for input(s): BNP in the last 8760 hours.  ProBNP (last 3 results) No results for input(s): PROBNP in the last 8760 hours.  Radiological Exams: 07/11/20 RENAL  Result Date: 05/09/2020 CLINICAL DATA:  Acute kidney injury EXAM: RENAL / URINARY TRACT ULTRASOUND COMPLETE COMPARISON:  None. FINDINGS: Right Kidney: Renal  measurements: Not visualized Left Kidney: Renal measurements: 10.2 x 5.7 x 5.9 cm = volume: 178 mL. Echogenicity within normal limits. No mass or hydronephrosis visualized. Bladder: Decompressed by Foley catheter. Other: Small volume ascites within RIGHT lower quadrant, LEFT lower quadrant and midline pelvis. IMPRESSION: 1. LEFT kidney appears normal. 2. RIGHT kidney is not visualized. 3. Small volume ascites. 4. Bladder decompressed by Foley catheter. Electronically Signed   By: Bary Richard M.D.   On: 05/09/2020 13:32   DG CHEST PORT 1 VIEW  Result Date: 05/11/2020 CLINICAL DATA:  Pneumonia.  Pleural effusion EXAM: PORTABLE CHEST 1 VIEW COMPARISON:  05/06/2020 FINDINGS: Cardiac enlargement. Shallow inspiration. Infiltrates in the lung bases, greater on the right. Probable right pleural effusion. Infiltrates are improved since prior study. Right PICC line and enteric tube are unchanged in position. IMPRESSION: Improving bilateral basilar infiltrates and right pleural effusion. Electronically Signed   By: Burman Nieves M.D.    On: 05/11/2020 06:31    Assessment/Plan Active Problems:   Acute on chronic respiratory failure with hypoxia (HCC)   COPD, severe (HCC)   Multifocal pneumonia   Severe sepsis (HCC)   Pleural effusion   1. Acute on chronic respiratory failure hypoxia try to continue to wean FiO2 down as tolerated 2. Severe COPD medical management 3. Multifocal pneumonia treated improving 4. Severe sepsis resolved 5. Pleural effusion no change we will continue to follow   I have personally seen and evaluated the patient, evaluated laboratory and imaging results, formulated the assessment and plan and placed orders. The Patient requires high complexity decision making with multiple systems involvement.  Rounds were done with the Respiratory Therapy Director and Staff therapists and discussed with nursing staff also.  Yevonne Pax, MD [facility] Pulmonary Critical Care Medicine Sleep Medicine

## 2020-05-12 DIAGNOSIS — J90 Pleural effusion, not elsewhere classified: Secondary | ICD-10-CM | POA: Diagnosis not present

## 2020-05-12 DIAGNOSIS — J9621 Acute and chronic respiratory failure with hypoxia: Secondary | ICD-10-CM | POA: Diagnosis not present

## 2020-05-12 DIAGNOSIS — J449 Chronic obstructive pulmonary disease, unspecified: Secondary | ICD-10-CM | POA: Diagnosis not present

## 2020-05-12 DIAGNOSIS — J189 Pneumonia, unspecified organism: Secondary | ICD-10-CM | POA: Diagnosis not present

## 2020-05-12 LAB — BPAM RBC
Blood Product Expiration Date: 202108072359
Blood Product Expiration Date: 202108242359
ISSUE DATE / TIME: 202107312338
ISSUE DATE / TIME: 202108031200
Unit Type and Rh: 1700
Unit Type and Rh: 8400

## 2020-05-12 LAB — RENAL FUNCTION PANEL
Albumin: 1.8 g/dL — ABNORMAL LOW (ref 3.5–5.0)
Anion gap: 7 (ref 5–15)
BUN: 52 mg/dL — ABNORMAL HIGH (ref 8–23)
CO2: 27 mmol/L (ref 22–32)
Calcium: 7.8 mg/dL — ABNORMAL LOW (ref 8.9–10.3)
Chloride: 108 mmol/L (ref 98–111)
Creatinine, Ser: 1.28 mg/dL — ABNORMAL HIGH (ref 0.44–1.00)
GFR calc Af Amer: 52 mL/min — ABNORMAL LOW (ref >60)
GFR calc non Af Amer: 45 mL/min — ABNORMAL LOW (ref >60)
Glucose, Bld: 264 mg/dL — ABNORMAL HIGH (ref 70–99)
Phosphorus: 4.1 mg/dL (ref 2.5–4.6)
Potassium: 3.8 mmol/L (ref 3.5–5.1)
Sodium: 142 mmol/L (ref 135–145)

## 2020-05-12 LAB — CBC
HCT: 25.8 % — ABNORMAL LOW (ref 36.0–46.0)
Hemoglobin: 8.2 g/dL — ABNORMAL LOW (ref 12.0–15.0)
MCH: 28.5 pg (ref 26.0–34.0)
MCHC: 31.8 g/dL (ref 30.0–36.0)
MCV: 89.6 fL (ref 80.0–100.0)
Platelets: 53 10*3/uL — ABNORMAL LOW (ref 150–400)
RBC: 2.88 MIL/uL — ABNORMAL LOW (ref 3.87–5.11)
RDW: 19.4 % — ABNORMAL HIGH (ref 11.5–15.5)
WBC: 7.1 10*3/uL (ref 4.0–10.5)
nRBC: 0 % (ref 0.0–0.2)

## 2020-05-12 LAB — TYPE AND SCREEN
ABO/RH(D): AB POS
Antibody Screen: NEGATIVE
Unit division: 0
Unit division: 0

## 2020-05-12 LAB — MAGNESIUM: Magnesium: 1.9 mg/dL (ref 1.7–2.4)

## 2020-05-12 NOTE — Progress Notes (Signed)
Pulmonary Critical Care Medicine [facility] [place]   PULMONARY CRITICAL CARE SERVICE  PROGRESS NOTE  Date of Service: 05/12/2020  Katherine Hampton  UUE:[id number]  DOB: April 16, 1961   DOA: 05/05/2020  Referring Physician: Carron Curie, MD  HPI: Katherine Hampton is a 59 y.o. female seen for follow up of Acute on Chronic Respiratory Failure.  Patient is on 3 L oxygen doing fairly well at this time.  Saturations are maintained  Medications: Reviewed on Rounds  Physical Exam:  Vitals: Temperature 98.2 pulse 62 respiratory 21 blood pressure is 107/83 saturations 96%  Ventilator Settings on 3 L nasal cannula  . General: Comfortable at this time . Eyes: Grossly normal lids, irises & conjunctiva . ENT: grossly tongue is normal . Neck: no obvious mass . Cardiovascular: S1 S2 normal no gallop . Respiratory: No rhonchi very coarse breath sounds . Abdomen: soft . Skin: no rash seen on limited exam . Musculoskeletal: not rigid . Psychiatric:unable to assess . Neurologic: no seizure no involuntary movements         Lab Data:   Basic Metabolic Panel: Recent Labs  Lab 05/06/20 0635 05/07/20 0549 05/08/20 1215 05/08/20 1215 05/09/20 0800 05/10/20 0437 05/10/20 1714 05/11/20 0630 05/12/20 0945  NA 151*   < > 148*   < > 149* 146* 141 143 142  K 3.7   < > 2.9*   < > 3.3* 4.2 3.4* 3.3* 3.8  CL 112*   < > 110   < > 110 109 105 108 108  CO2 29   < > 27   < > 27 26 26 28 27   GLUCOSE 96   < > 300*   < > 229* 186* 343* 179* 264*  BUN 74*   < > 59*   < > 57* 56* 50* 52* 52*  CREATININE 1.30*   < > 1.39*   < > 1.45* 1.33* 1.27* 1.21* 1.28*  CALCIUM 7.8*   < > 8.0*   < > 8.4* 8.0* 7.7* 7.9* 7.8*  MG 2.2  --  2.2  --   --   --   --  1.9 1.9  PHOS 4.9*  --   --   --   --  3.4  --  3.7 4.1   < > = values in this interval not displayed.    ABG: Recent Labs  Lab 05/05/20 1738 05/08/20 0951  PHART 7.476* 7.499*  PCO2ART 39.6 37.6  PO2ART 56.6* 82.8*  HCO3 29.2* 28.9*  O2SAT 90.5  97.1    Liver Function Tests: Recent Labs  Lab 05/06/20 0635 05/10/20 0437 05/11/20 0630 05/12/20 0945  AST 75*  --   --   --   ALT 106*  --   --   --   ALKPHOS 85  --   --   --   BILITOT 0.7  --   --   --   PROT 4.7*  --   --   --   ALBUMIN 1.6* 2.0* 1.8* 1.8*   No results for input(s): LIPASE, AMYLASE in the last 168 hours. No results for input(s): AMMONIA in the last 168 hours.  CBC: Recent Labs  Lab 05/06/20 0635 05/08/20 1215 05/09/20 0800 05/11/20 0630 05/12/20 0945  WBC 8.2 6.8 9.1 6.4 7.1  NEUTROABS 6.9  --   --   --   --   HGB 7.4* 6.3* 7.5* 6.8* 8.2*  HCT 24.4* 21.2* 24.5* 22.5* 25.8*  MCV 87.8 89.1 88.8 90.4 89.6  PLT  94* 82* 79* 55* 53*    Cardiac Enzymes: No results for input(s): CKTOTAL, CKMB, CKMBINDEX, TROPONINI in the last 168 hours.  BNP (last 3 results) No results for input(s): BNP in the last 8760 hours.  ProBNP (last 3 results) No results for input(s): PROBNP in the last 8760 hours.  Radiological Exams: DG CHEST PORT 1 VIEW  Result Date: 05/11/2020 CLINICAL DATA:  Pneumonia.  Pleural effusion EXAM: PORTABLE CHEST 1 VIEW COMPARISON:  05/06/2020 FINDINGS: Cardiac enlargement. Shallow inspiration. Infiltrates in the lung bases, greater on the right. Probable right pleural effusion. Infiltrates are improved since prior study. Right PICC line and enteric tube are unchanged in position. IMPRESSION: Improving bilateral basilar infiltrates and right pleural effusion. Electronically Signed   By: Burman Nieves M.D.   On: 05/11/2020 06:31    Assessment/Plan Active Problems:   Acute on chronic respiratory failure with hypoxia (HCC)   COPD, severe (HCC)   Multifocal pneumonia   Severe sepsis (HCC)   Pleural effusion   1. Acute on chronic respiratory failure hypoxia patient currently is on 3 L of oxygen continue to titrate 2. Severe COPD at baseline we will continue to follow along 3. Multifocal pneumonia improving we will continue to follow-up  radiologically.  Last chest x-ray was showing improving infiltrates. 4. Severe sepsis clinically improving we will continue to monitor. 5. Pleural effusions following with radiological studies there is still with right-sided pleural effusion   I have personally seen and evaluated the patient, evaluated laboratory and imaging results, formulated the assessment and plan and placed orders. The Patient requires high complexity decision making with multiple systems involvement.  Rounds were done with the Respiratory Therapy Director and Staff therapists and discussed with nursing staff also.  Yevonne Pax, MD [facility] Pulmonary Critical Care Medicine Sleep Medicine

## 2020-05-13 ENCOUNTER — Other Ambulatory Visit (HOSPITAL_COMMUNITY): Payer: Medicare Other

## 2020-05-13 DIAGNOSIS — J189 Pneumonia, unspecified organism: Secondary | ICD-10-CM | POA: Diagnosis not present

## 2020-05-13 DIAGNOSIS — J9621 Acute and chronic respiratory failure with hypoxia: Secondary | ICD-10-CM | POA: Diagnosis not present

## 2020-05-13 DIAGNOSIS — J449 Chronic obstructive pulmonary disease, unspecified: Secondary | ICD-10-CM | POA: Diagnosis not present

## 2020-05-13 DIAGNOSIS — J90 Pleural effusion, not elsewhere classified: Secondary | ICD-10-CM | POA: Diagnosis not present

## 2020-05-13 LAB — RENAL FUNCTION PANEL
Albumin: 1.9 g/dL — ABNORMAL LOW (ref 3.5–5.0)
Anion gap: 10 (ref 5–15)
BUN: 55 mg/dL — ABNORMAL HIGH (ref 8–23)
CO2: 26 mmol/L (ref 22–32)
Calcium: 7.9 mg/dL — ABNORMAL LOW (ref 8.9–10.3)
Chloride: 106 mmol/L (ref 98–111)
Creatinine, Ser: 1.26 mg/dL — ABNORMAL HIGH (ref 0.44–1.00)
GFR calc Af Amer: 53 mL/min — ABNORMAL LOW (ref >60)
GFR calc non Af Amer: 46 mL/min — ABNORMAL LOW (ref >60)
Glucose, Bld: 247 mg/dL — ABNORMAL HIGH (ref 70–99)
Phosphorus: 4.2 mg/dL (ref 2.5–4.6)
Potassium: 3.6 mmol/L (ref 3.5–5.1)
Sodium: 142 mmol/L (ref 135–145)

## 2020-05-13 LAB — CBC
HCT: 27.1 % — ABNORMAL LOW (ref 36.0–46.0)
Hemoglobin: 8.7 g/dL — ABNORMAL LOW (ref 12.0–15.0)
MCH: 28.5 pg (ref 26.0–34.0)
MCHC: 32.1 g/dL (ref 30.0–36.0)
MCV: 88.9 fL (ref 80.0–100.0)
Platelets: 62 10*3/uL — ABNORMAL LOW (ref 150–400)
RBC: 3.05 MIL/uL — ABNORMAL LOW (ref 3.87–5.11)
RDW: 19.2 % — ABNORMAL HIGH (ref 11.5–15.5)
WBC: 9.8 10*3/uL (ref 4.0–10.5)
nRBC: 0 % (ref 0.0–0.2)

## 2020-05-13 LAB — OCCULT BLOOD X 1 CARD TO LAB, STOOL: Fecal Occult Bld: POSITIVE — AB

## 2020-05-13 LAB — MAGNESIUM: Magnesium: 2 mg/dL (ref 1.7–2.4)

## 2020-05-13 NOTE — Progress Notes (Signed)
Pulmonary Critical Care Medicine [facility] [place]   PULMONARY CRITICAL CARE SERVICE  PROGRESS NOTE  Date of Service: 05/13/2020  Katherine Hampton  IOE:[id number]  DOB: April 25, 1961   DOA: 05/05/2020  Referring Physician: Carron Curie, MD  HPI: Katherine Hampton is a 59 y.o. female seen for follow up of Acute on Chronic Respiratory Failure.  Patient currently is on 2 L of oxygen has been refusing BiPAP good saturations are noted at this time  Medications: Reviewed on Rounds  Physical Exam:  Vitals: Temperature is 96.6 pulse 66 respiratory rate 24 blood pressure is 111/47 saturations 92%  Ventilator Settings on 2 L of oxygen refusing BiPAP  . General: Comfortable at this time . Eyes: Grossly normal lids, irises & conjunctiva . ENT: grossly tongue is normal . Neck: no obvious mass . Cardiovascular: S1 S2 normal no gallop . Respiratory: No rhonchi coarse breath sounds . Abdomen: soft . Skin: no rash seen on limited exam . Musculoskeletal: not rigid . Psychiatric:unable to assess . Neurologic: no seizure no involuntary movements         Lab Data:   Basic Metabolic Panel: Recent Labs  Lab 05/08/20 1215 05/09/20 0800 05/10/20 0437 05/10/20 1714 05/11/20 0630 05/12/20 0945 05/13/20 0500  NA 148*   < > 146* 141 143 142 142  K 2.9*   < > 4.2 3.4* 3.3* 3.8 3.6  CL 110   < > 109 105 108 108 106  CO2 27   < > 26 26 28 27 26   GLUCOSE 300*   < > 186* 343* 179* 264* 247*  BUN 59*   < > 56* 50* 52* 52* 55*  CREATININE 1.39*   < > 1.33* 1.27* 1.21* 1.28* 1.26*  CALCIUM 8.0*   < > 8.0* 7.7* 7.9* 7.8* 7.9*  MG 2.2  --   --   --  1.9 1.9 2.0  PHOS  --   --  3.4  --  3.7 4.1 4.2   < > = values in this interval not displayed.    ABG: Recent Labs  Lab 05/08/20 0951  PHART 7.499*  PCO2ART 37.6  PO2ART 82.8*  HCO3 28.9*  O2SAT 97.1    Liver Function Tests: Recent Labs  Lab 05/10/20 0437 05/11/20 0630 05/12/20 0945 05/13/20 0500  ALBUMIN 2.0* 1.8* 1.8* 1.9*   No  results for input(s): LIPASE, AMYLASE in the last 168 hours. No results for input(s): AMMONIA in the last 168 hours.  CBC: Recent Labs  Lab 05/08/20 1215 05/09/20 0800 05/11/20 0630 05/12/20 0945 05/13/20 0500  WBC 6.8 9.1 6.4 7.1 9.8  HGB 6.3* 7.5* 6.8* 8.2* 8.7*  HCT 21.2* 24.5* 22.5* 25.8* 27.1*  MCV 89.1 88.8 90.4 89.6 88.9  PLT 82* 79* 55* 53* 62*    Cardiac Enzymes: No results for input(s): CKTOTAL, CKMB, CKMBINDEX, TROPONINI in the last 168 hours.  BNP (last 3 results) No results for input(s): BNP in the last 8760 hours.  ProBNP (last 3 results) No results for input(s): PROBNP in the last 8760 hours.  Radiological Exams: DG CHEST PORT 1 VIEW  Result Date: 05/13/2020 CLINICAL DATA:  Pneumonia.  Pleural effusion. EXAM: PORTABLE CHEST 1 VIEW COMPARISON:  05/11/2020. FINDINGS: Right PICC line and NG tube in stable position. Cardiomegaly with diffuse bilateral interstitial prominence slightly progressed from prior exam. Persistent bibasilar atelectasis/infiltrates. Right pleural effusion unchanged. Small left pleural effusion cannot be excluded on today's exam. No pneumothorax. IMPRESSION: 1.  Right PICC line and NG tube in stable position.  2. Cardiomegaly with diffuse bilateral interstitial prominence slightly progressed from prior exam suggesting progressive CHF. Pneumonitis cannot be excluded. Right pleural effusion unchanged. Small left pleural effusion cannot be excluded on today's exam. 3.  Persistent bibasilar atelectasis/infiltrates. Electronically Signed   By: Maisie Fus  Register   On: 05/13/2020 06:22    Assessment/Plan Active Problems:   Acute on chronic respiratory failure with hypoxia (HCC)   COPD, severe (HCC)   Multifocal pneumonia   Severe sepsis (HCC)   Pleural effusion   1. Acute on chronic respiratory failure with hypoxia she is on 2 L of O2 which will be continued basically has been refusing BiPAP as already indicated. 2. Severe COPD medical management  continue with supportive care. 3. Multifocal pneumonia chest x-ray results as above with some residual pneumonitis we will continue to follow. 4. Severe sepsis resolved 5. Pleural effusion monitoring x-rays   I have personally seen and evaluated the patient, evaluated laboratory and imaging results, formulated the assessment and plan and placed orders. The Patient requires high complexity decision making with multiple systems involvement.  Rounds were done with the Respiratory Therapy Director and Staff therapists and discussed with nursing staff also.  Yevonne Pax, MD [facility] Pulmonary Critical Care Medicine Sleep Medicine

## 2020-05-14 ENCOUNTER — Other Ambulatory Visit (HOSPITAL_COMMUNITY): Payer: Medicare Other

## 2020-05-14 DIAGNOSIS — J189 Pneumonia, unspecified organism: Secondary | ICD-10-CM | POA: Diagnosis not present

## 2020-05-14 DIAGNOSIS — J9621 Acute and chronic respiratory failure with hypoxia: Secondary | ICD-10-CM | POA: Diagnosis not present

## 2020-05-14 DIAGNOSIS — J90 Pleural effusion, not elsewhere classified: Secondary | ICD-10-CM | POA: Diagnosis not present

## 2020-05-14 DIAGNOSIS — J449 Chronic obstructive pulmonary disease, unspecified: Secondary | ICD-10-CM | POA: Diagnosis not present

## 2020-05-14 LAB — CBC
HCT: 27.8 % — ABNORMAL LOW (ref 36.0–46.0)
Hemoglobin: 8.7 g/dL — ABNORMAL LOW (ref 12.0–15.0)
MCH: 27.7 pg (ref 26.0–34.0)
MCHC: 31.3 g/dL (ref 30.0–36.0)
MCV: 88.5 fL (ref 80.0–100.0)
Platelets: 62 10*3/uL — ABNORMAL LOW (ref 150–400)
RBC: 3.14 MIL/uL — ABNORMAL LOW (ref 3.87–5.11)
RDW: 18.7 % — ABNORMAL HIGH (ref 11.5–15.5)
WBC: 7.1 10*3/uL (ref 4.0–10.5)
nRBC: 0 % (ref 0.0–0.2)

## 2020-05-14 LAB — RENAL FUNCTION PANEL
Albumin: 2.1 g/dL — ABNORMAL LOW (ref 3.5–5.0)
Anion gap: 13 (ref 5–15)
BUN: 57 mg/dL — ABNORMAL HIGH (ref 8–23)
CO2: 24 mmol/L (ref 22–32)
Calcium: 7.9 mg/dL — ABNORMAL LOW (ref 8.9–10.3)
Chloride: 106 mmol/L (ref 98–111)
Creatinine, Ser: 1.28 mg/dL — ABNORMAL HIGH (ref 0.44–1.00)
GFR calc Af Amer: 52 mL/min — ABNORMAL LOW (ref >60)
GFR calc non Af Amer: 45 mL/min — ABNORMAL LOW (ref >60)
Glucose, Bld: 170 mg/dL — ABNORMAL HIGH (ref 70–99)
Phosphorus: 4.8 mg/dL — ABNORMAL HIGH (ref 2.5–4.6)
Potassium: 3.1 mmol/L — ABNORMAL LOW (ref 3.5–5.1)
Sodium: 143 mmol/L (ref 135–145)

## 2020-05-14 LAB — HEPARIN INDUCED PLATELET AB (HIT ANTIBODY): Heparin Induced Plt Ab: 0.092 OD (ref 0.000–0.400)

## 2020-05-14 LAB — MAGNESIUM: Magnesium: 1.7 mg/dL (ref 1.7–2.4)

## 2020-05-14 LAB — VANCOMYCIN, TROUGH: Vancomycin Tr: 13 ug/mL — ABNORMAL LOW (ref 15–20)

## 2020-05-14 NOTE — Progress Notes (Addendum)
Pulmonary Critical Care Medicine [facility] [place]   PULMONARY CRITICAL CARE SERVICE  PROGRESS NOTE  Date of Service: 05/14/2020  Katherine Hampton  JAS:[id number]  DOB: 06-13-1961   DOA: 05/05/2020  Referring Physician: Carron Curie, MD  HPI: Katherine Hampton is a 59 y.o. female seen for follow up of Acute on Chronic Respiratory Failure.  Patient remains on 2 L nasal cannula satting well at this time no fever or distress.  Medications: Reviewed on Rounds  Physical Exam:  Vitals: Pulse 84 respirations 32 BP 107/55 O2 sat 98% temp 95.9  Ventilator Settings 2 L nasal cannula  . General: Comfortable at this time . Eyes: Grossly normal lids, irises & conjunctiva . ENT: grossly tongue is normal . Neck: no obvious mass . Cardiovascular: S1 S2 normal no gallop . Respiratory: No rales or rhonchi noted . Abdomen: soft . Skin: no rash seen on limited exam . Musculoskeletal: not rigid . Psychiatric:unable to assess . Neurologic: no seizure no involuntary movements         Lab Data:   Basic Metabolic Panel: Recent Labs  Lab 05/08/20 1215 05/09/20 0800 05/10/20 0437 05/10/20 0437 05/10/20 1714 05/11/20 0630 05/12/20 0945 05/13/20 0500 05/14/20 0818  NA 148*   < > 146*   < > 141 143 142 142 143  K 2.9*   < > 4.2   < > 3.4* 3.3* 3.8 3.6 3.1*  CL 110   < > 109   < > 105 108 108 106 106  CO2 27   < > 26   < > 26 28 27 26 24   GLUCOSE 300*   < > 186*   < > 343* 179* 264* 247* 170*  BUN 59*   < > 56*   < > 50* 52* 52* 55* 57*  CREATININE 1.39*   < > 1.33*   < > 1.27* 1.21* 1.28* 1.26* 1.28*  CALCIUM 8.0*   < > 8.0*   < > 7.7* 7.9* 7.8* 7.9* 7.9*  MG 2.2  --   --   --   --  1.9 1.9 2.0 1.7  PHOS  --   --  3.4  --   --  3.7 4.1 4.2 4.8*   < > = values in this interval not displayed.    ABG: Recent Labs  Lab 05/08/20 0951  PHART 7.499*  PCO2ART 37.6  PO2ART 82.8*  HCO3 28.9*  O2SAT 97.1    Liver Function Tests: Recent Labs  Lab 05/10/20 0437 05/11/20 0630  05/12/20 0945 05/13/20 0500 05/14/20 0818  ALBUMIN 2.0* 1.8* 1.8* 1.9* 2.1*   No results for input(s): LIPASE, AMYLASE in the last 168 hours. No results for input(s): AMMONIA in the last 168 hours.  CBC: Recent Labs  Lab 05/09/20 0800 05/11/20 0630 05/12/20 0945 05/13/20 0500 05/14/20 0818  WBC 9.1 6.4 7.1 9.8 7.1  HGB 7.5* 6.8* 8.2* 8.7* 8.7*  HCT 24.5* 22.5* 25.8* 27.1* 27.8*  MCV 88.8 90.4 89.6 88.9 88.5  PLT 79* 55* 53* 62* 62*    Cardiac Enzymes: No results for input(s): CKTOTAL, CKMB, CKMBINDEX, TROPONINI in the last 168 hours.  BNP (last 3 results) No results for input(s): BNP in the last 8760 hours.  ProBNP (last 3 results) No results for input(s): PROBNP in the last 8760 hours.  Radiological Exams: DG CHEST PORT 1 VIEW  Result Date: 05/13/2020 CLINICAL DATA:  Pneumonia.  Pleural effusion. EXAM: PORTABLE CHEST 1 VIEW COMPARISON:  05/11/2020. FINDINGS: Right PICC line and NG  tube in stable position. Cardiomegaly with diffuse bilateral interstitial prominence slightly progressed from prior exam. Persistent bibasilar atelectasis/infiltrates. Right pleural effusion unchanged. Small left pleural effusion cannot be excluded on today's exam. No pneumothorax. IMPRESSION: 1.  Right PICC line and NG tube in stable position. 2. Cardiomegaly with diffuse bilateral interstitial prominence slightly progressed from prior exam suggesting progressive CHF. Pneumonitis cannot be excluded. Right pleural effusion unchanged. Small left pleural effusion cannot be excluded on today's exam. 3.  Persistent bibasilar atelectasis/infiltrates. Electronically Signed   By: Maisie Fus  Register   On: 05/13/2020 06:22   DG Abd Portable 1V  Result Date: 05/14/2020 CLINICAL DATA:  Nasogastric tube placement EXAM: PORTABLE ABDOMEN - 1 VIEW COMPARISON:  05/05/2020 FINDINGS: Nasogastric tube with tip at the mid stomach. Pulmonary disease as seen on preceding chest x-ray. Limited coverage of upper abdominal bowel  loops. No overt obstructive pattern. No concerning mass effect or gas collection. IMPRESSION: Nasogastric tube with tip at the mid stomach. Electronically Signed   By: Marnee Spring M.D.   On: 05/14/2020 04:03    Assessment/Plan Active Problems:   Acute on chronic respiratory failure with hypoxia (HCC)   COPD, severe (HCC)   Multifocal pneumonia   Severe sepsis (HCC)   Pleural effusion   1. Acute on chronic respiratory failure with hypoxia she is on 2 L of O2 which will be continued basically has been refusing BiPAP. 2. Severe COPD medical management continue with supportive care. 3. Multifocal pneumonia chest x-ray results as above with some residual pneumonitis we will continue to follow. 4. Severe sepsis resolved 5. Pleural effusion continue to monitor   I have personally seen and evaluated the patient, evaluated laboratory and imaging results, formulated the assessment and plan and placed orders. The Patient requires high complexity decision making with multiple systems involvement.  Rounds were done with the Respiratory Therapy Director and Staff therapists and discussed with nursing staff also.  Yevonne Pax, MD [facility] Pulmonary Critical Care Medicine Sleep Medicine

## 2020-05-14 NOTE — Consult Note (Signed)
Infectious Disease Consultation   Katherine Hampton  NUU:[id number]  DOB: 1961/03/05  DOA: 05/05/2020  Requesting physician: Dr.Brown  Reason for consultation: Antibiotic recommendations   History of Present Illness: Katherine Hampton is an 59 y.o. female that presented to the outside hospital with complaints of nausea, vomiting, fever.  She was found to have sepsis likely source from cellulitis, osteomyelitis of the right foot.  Patient also had acute hypoxemic respiratory failure and was found to have multifocal pneumonia.  Pulmonary was consulted.  Patient was placed on BiPAP.  She also has chronic problems of obstructive sleep apnea.  Infectious disease was consulted for the osteomyelitis.  Patient hospital course complicated by MRSA bacteremia.  She was treated with IV antibiotics.  She was previously on aztreonam from 7/2-7/6, ciprofloxacin from 7/6-7/8 and vancomycin starting 7/1.  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  She had arterial duplex ultrasound and ABI studies which showed right lower extremity resting abnormal ABIs and right superficial femoral artery stenosis.  Hospital course complicated by acute kidney injury which improved by IV hydration.  She also had encephalopathy/delirium throughout her hospitalization.  Neurology was consulted.  Patient was unable to obtain MRI of the brain.  Her mental status slowly improved.  However, she continued to be dependent on the BiPAP.  She also had positive FOBT with anemia.  GI was consulted.  She apparently was not a candidate for EGD or colonoscopy.  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  They recommended PRBC transfusion as needed.  NG tube was placed because the patient was unable to have consistent nutrition secondary to her needing BiPAP.  She also developed anasarca due to low albumin which improved with IV Lasix.  Due to her complex medical problems she was transferred to [facility] and  was admitted here on 05/05/2020.  She denies having any chest pain, fevers, chills, nausea, vomiting, diarrhea or dysuria.  She is on 2 L of oxygen, refusing BiPAP. Patient having dysphagia and currently has an NG tube in place.  She is very edematous in both her upper and lower extremities.    Review of Systems:  Review of systems negative except as mentioned above in the HPI.   Past Medical History: Past Medical History:  Diagnosis Date  . Acute on chronic respiratory failure with hypoxia ([place])   . COPD, severe ([place])   . Multifocal pneumonia   . Pleural effusion   . Severe sepsis ([place])   . Anxiety  . Arthritis  rheumatoid  . Chronic back pain  goes to pain clinic   Diabetes mellitus  . Hypertension  . Rheumatoid arthritis  Past Surgical History: . Carpal tunnel release  . Cholecystectomy  . Foot surgery  . Heel spur excision  . Spine surgery  lumbar  . Toe amputation 10/14/2016  . Tonsillectomy   Allergies: Allergen Reactions  . Cashew Anaphylaxis  . Other Itching and Cough  PET DANDER  . Penicillins Anaphylaxis  . Morphine Nausea And Vomiting  . Sulfa Antibiotics Nausea And Vomiting    Social History: . Smoking status: Current Every Day Smoker  Packs/day: 0.50  Types: Cigarettes  . Smokeless tobacco: Never Used  . Alcohol use: No  . Drug use: No    Family History: . Heart disease Father  . Diabetes Father    Physical Exam: Vitals: Temperature 97.4, pulse 93, respiratory 29, blood pressure 131/66, pulse oximetry 94%. Constitutional:  Chronically ill-appearing obese female, awake, oriented x2, not in any acute distress  Head: Atraumatic, normocephalic Eyes: PERLA, EOMI, irises appear normal, anicteric sclera,  ENMT: external ears and nose appear normal, normal hearing, Lips appears normal, moist oral mucosa Neck: neck appears normal, no masses, normal ROM CVS: S1-S2 clear, no murmur  Respiratory: Decreased breath sound lower lobes, coarse breath sounds,  no wheezing Abdomen: Obese, nontender, positive bowel sounds Musculoskeletal: Bilateral upper and lower extremity edema.  Right lower extremity foot with dressing in place status post amputation. Neuro: She has debility with generalized weakness, otherwise grossly nonfocal Psych: judgement and insight appear normal, stable mood and affect Skin: Right foot surgical site with dressing in place, right lateral malleolus deep tissue pressure injury, healed scars noted on left lower extremity which the patient says by previous "bug bites" from her couch?  Data reviewed:  I have personally reviewed following labs and imaging studies Labs:  CBC: Recent Labs  Lab 05/09/20 0800 05/11/20 0630 05/12/20 0945 05/13/20 0500 05/14/20 0818  WBC 9.1 6.4 7.1 9.8 7.1  HGB 7.5* 6.8* 8.2* 8.7* 8.7*  HCT 24.5* 22.5* 25.8* 27.1* 27.8*  MCV 88.8 90.4 89.6 88.9 88.5  PLT 79* 55* 53* 62* 62*    Basic Metabolic Panel: Recent Labs  Lab 05/08/20 1215 05/09/20 0800 05/10/20 0437 05/10/20 0437 05/10/20 1714 05/10/20 1714 05/11/20 0630 05/11/20 0630 05/12/20 0945 05/12/20 0945 05/13/20 0500 05/14/20 0818  NA 148*   < > 146*   < > 141  --  143  --  142  --  142 143  K 2.9*   < > 4.2   < > 3.4*   < > 3.3*   < > 3.8   < > 3.6 3.1*  CL 110   < > 109   < > 105  --  108  --  108  --  106 106  CO2 27   < > 26   < > 26  --  28  --  27  --  26 24  GLUCOSE 300*   < > 186*   < > 343*  --  179*  --  264*  --  247* 170*  BUN 59*   < > 56*   < > 50*  --  52*  --  52*  --  55* 57*  CREATININE 1.39*   < > 1.33*   < > 1.27*  --  1.21*  --  1.28*  --  1.26* 1.28*  CALCIUM 8.0*   < > 8.0*   < > 7.7*  --  7.9*  --  7.8*  --  7.9* 7.9*  MG 2.2  --   --   --   --   --  1.9  --  1.9  --  2.0 1.7  PHOS  --   --  3.4  --   --   --  3.7  --  4.1  --  4.2 4.8*   < > = values in this interval not displayed.   GFR CrCl cannot be calculated (Unknown ideal weight.). Liver Function Tests: Recent Labs  Lab 05/10/20 0437  05/11/20 0630 05/12/20 0945 05/13/20 0500 05/14/20 0818  ALBUMIN 2.0* 1.8* 1.8* 1.9* 2.1*   No results for input(s): LIPASE, AMYLASE in the last 168 hours. No results for input(s): AMMONIA in the last 168 hours. Coagulation profile No results for input(s): INR, PROTIME in the last 168 hours.  Cardiac Enzymes: No results for input(s):  CKTOTAL, CKMB, CKMBINDEX, TROPONINI in the last 168 hours. BNP: Invalid input(s): POCBNP CBG: No results for input(s): GLUCAP in the last 168 hours. D-Dimer No results for input(s): DDIMER in the last 72 hours. Hgb A1c No results for input(s): HGBA1C in the last 72 hours. Lipid Profile No results for input(s): CHOL, HDL, LDLCALC, TRIG, CHOLHDL, LDLDIRECT in the last 72 hours. Thyroid function studies No results for input(s): TSH, T4TOTAL, T3FREE, THYROIDAB in the last 72 hours.  Invalid input(s): FREET3 Anemia work up No results for input(s): VITAMINB12, FOLATE, FERRITIN, TIBC, IRON, RETICCTPCT in the last 72 hours. Urinalysis No results found for: COLORURINE, APPEARANCEUR, [place], [place], [place], [place], [place], [place], [place], UROBILINOGEN, NITRITE, [place]   Microbiology Recent Results (from the past 240 hour(s))  Gram stain     Status: None   Collection Time: 05/06/20 11:51 AM   Specimen: Lung, Right; Pleural Fluid  Result Value Ref Range Status   Specimen Description PLEURAL FLUID  Final   Special Requests RIGHT LUNG  Final   Gram Stain   Final    WBC PRESENT,BOTH PMN AND MONONUCLEAR NO ORGANISMS SEEN CYTOSPIN SMEAR Performed at [facility], [address] [place], [place] [address]    Report Status 05/06/2020 FINAL  Final  Culture, blood (routine x 2)     Status: None   Collection Time: 05/06/20  3:32 PM   Specimen: BLOOD  Result Value Ref Range Status   Specimen Description BLOOD LEFT ANTECUBITAL  Final   Special Requests   Final    BOTTLES DRAWN AEROBIC ONLY Blood Culture results may not be optimal  due to an inadequate volume of blood received in culture bottles   Culture   Final    NO GROWTH 5 DAYS Performed at [facility], [place] [address] [place], [place] [address]    Report Status 05/11/2020 FINAL  Final  Culture, blood (routine x 2)     Status: None   Collection Time: 05/06/20  4:07 PM   Specimen: BLOOD  Result Value Ref Range Status   Specimen Description BLOOD LEFT ANTECUBITAL  Final   Special Requests   Final    BOTTLES DRAWN AEROBIC ONLY Blood Culture adequate volume   Culture   Final    NO GROWTH 5 DAYS Performed at [facility], [place] [address] [place], [place] [address]    Report Status 05/11/2020 FINAL  Final      Inpatient Medications:   Please see MAR   Radiological Exams on Admission: DG CHEST PORT 1 VIEW  Result Date: 05/13/2020 CLINICAL DATA:  Pneumonia.  Pleural effusion. EXAM: PORTABLE CHEST 1 VIEW COMPARISON:  05/11/2020. FINDINGS: Right PICC line and NG tube in stable position. Cardiomegaly with diffuse bilateral interstitial prominence slightly progressed from prior exam. Persistent bibasilar atelectasis/infiltrates. Right pleural effusion unchanged. Small left pleural effusion cannot be excluded on today's exam. No pneumothorax. IMPRESSION: 1.  Right PICC line and NG tube in stable position. 2. Cardiomegaly with diffuse bilateral interstitial prominence slightly progressed from prior exam suggesting progressive CHF. Pneumonitis cannot be excluded. Right pleural effusion unchanged. Small left pleural effusion cannot be excluded on today's exam. 3.  Persistent bibasilar atelectasis/infiltrates. Electronically Signed   By: Marcello Moores  Register   On: 05/13/2020 06:22   DG Abd Portable 1V  Result Date: 05/14/2020 CLINICAL DATA:  Nasogastric tube placement EXAM: PORTABLE ABDOMEN - 1 VIEW COMPARISON:  05/05/2020 FINDINGS: Nasogastric tube with tip at the mid stomach. Pulmonary disease as seen on preceding  chest x-ray. Limited coverage of upper abdominal  bowel loops. No overt obstructive pattern. No concerning mass effect or gas collection. IMPRESSION: Nasogastric tube with tip at the mid stomach. Electronically Signed   By: Monte Fantasia M.D.   On: 05/14/2020 04:03    Impression/Recommendations Active Problems:   Acute on chronic respiratory failure with hypoxia  Severe sepsis, currently sepsis resolved Multifocal pneumonia Right foot cellulitis, abscess Osteomyelitis right foot MRSA bacteremia   COPD, severe CKD stage III Peripheral vascular disease Dysphagia  Acute on chronic respiratory failure with hypoxemia: Multifactorial etiology.  Patient has severe COPD, ongoing smoking history, also has obstructive sleep apnea and had multifocal pneumonia at the outside facility.  She is status post antibiotic treatment as mentioned above.  Currently on IV vancomycin, ciprofloxacin for treatment of osteomyelitis.  Currently on 2 L oxygen by nasal cannula.  Unfortunately she also has dysphagia therefore at risk for aspiration and worsening respiratory failure secondary to aspiration pneumonia.  Pulmonary following.  If her respiratory status worsens would recommend to repeat chest imaging preferably chest CT which could be done without contrast.  Severe sepsis: She had severe sepsis at the outside facility likely secondary to the MRSA bacteremia, right foot cellulitis with abscess and osteomyelitis.  She was already treated with antibiotics as mentioned above.  Currently on treatment with IV vancomycin, ciprofloxacin for the right foot osteomyelitis.  She is however, high risk for recurrent sepsis.  If she starts having any worsening fevers or leukocytosis would recommend to send for pan cultures.  MRSA bacteremia: Likely source from osteomyelitis and abscess of right foot, status post TMA.  She was already treated for the MRSA bacteremia.Transthoracic echocardiogram done at the outside facility with no vegetation. Blood culture on 04/08/20 with MRSA  and repeat blood cultures on 04/11/20 negative   However given the osteomyelitis, currently on treatment with IV vancomycin, ciprofloxacin with a tentative end date 05/22/2020.  Again, as mentioned above she is high risk for recurrent sepsis and bacteremia.  If she starts having any worsening fevers, leukocytosis would recommend to send for pan cultures.  Right foot cellulitis/abscess/osteomyelitis: She is status post TMA and currently has a dressing in place. Intra-operative culture on 04/09/20 with MRSA, klebsiella oxytoca and Strep viridans.  Per records from the outside facility, pathology does not show clean resection margins therefore continue treatment with IV vancomycin, ciprofloxacin with a tentative end date of 05/22/2020 pending improvement. Continue local wound care.  Of the wound is worsening then consider sending cultures and consulting surgery.  She also has CKD.  Please monitor BUN/trending closely while on antibiotics and adjust dose accordingly.  Suggest to check ESR, CRP Q 3-4 days.  Severe COPD: Pulmonary following.  Continue management per pulmonary and the primary team.   CKD stage III: Patient has CKD stage III.  Please monitor BUN/trending closely while on antibiotics and adjust dose accordingly.  Avoid nephrotoxic medications.  PVD  management per the primary team.  Dysphagia: Patient currently has dysphagia and is at high risk for aspiration.  She has an NG tube in place.  Further management per the primary team.    Due to her complex medical problems she is very high risk for worsening and decompensation.  Thank you for this consultation.    Yaakov Guthrie M.D. 05/14/2020, 3:13 PM

## 2020-05-15 ENCOUNTER — Other Ambulatory Visit (HOSPITAL_COMMUNITY): Payer: Medicare Other

## 2020-05-15 LAB — RENAL FUNCTION PANEL
Albumin: 2.1 g/dL — ABNORMAL LOW (ref 3.5–5.0)
Anion gap: 9 (ref 5–15)
BUN: 57 mg/dL — ABNORMAL HIGH (ref 8–23)
CO2: 25 mmol/L (ref 22–32)
Calcium: 7.7 mg/dL — ABNORMAL LOW (ref 8.9–10.3)
Chloride: 104 mmol/L (ref 98–111)
Creatinine, Ser: 1.18 mg/dL — ABNORMAL HIGH (ref 0.44–1.00)
GFR calc Af Amer: 58 mL/min — ABNORMAL LOW (ref >60)
GFR calc non Af Amer: 50 mL/min — ABNORMAL LOW (ref >60)
Glucose, Bld: 252 mg/dL — ABNORMAL HIGH (ref 70–99)
Phosphorus: 4.3 mg/dL (ref 2.5–4.6)
Potassium: 3.4 mmol/L — ABNORMAL LOW (ref 3.5–5.1)
Sodium: 138 mmol/L (ref 135–145)

## 2020-05-15 LAB — CBC
HCT: 26.6 % — ABNORMAL LOW (ref 36.0–46.0)
Hemoglobin: 8.4 g/dL — ABNORMAL LOW (ref 12.0–15.0)
MCH: 28.3 pg (ref 26.0–34.0)
MCHC: 31.6 g/dL (ref 30.0–36.0)
MCV: 89.6 fL (ref 80.0–100.0)
Platelets: 65 10*3/uL — ABNORMAL LOW (ref 150–400)
RBC: 2.97 MIL/uL — ABNORMAL LOW (ref 3.87–5.11)
RDW: 18.7 % — ABNORMAL HIGH (ref 11.5–15.5)
WBC: 5 10*3/uL (ref 4.0–10.5)
nRBC: 0 % (ref 0.0–0.2)

## 2020-05-15 LAB — EXPECTORATED SPUTUM ASSESSMENT W GRAM STAIN, RFLX TO RESP C

## 2020-05-15 LAB — MAGNESIUM: Magnesium: 1.7 mg/dL (ref 1.7–2.4)

## 2020-05-16 ENCOUNTER — Encounter (HOSPITAL_BASED_OUTPATIENT_CLINIC_OR_DEPARTMENT_OTHER): Payer: Medicare Other

## 2020-05-16 DIAGNOSIS — J9621 Acute and chronic respiratory failure with hypoxia: Secondary | ICD-10-CM

## 2020-05-16 LAB — RENAL FUNCTION PANEL
Albumin: 2.1 g/dL — ABNORMAL LOW (ref 3.5–5.0)
Anion gap: 11 (ref 5–15)
BUN: 58 mg/dL — ABNORMAL HIGH (ref 8–23)
CO2: 26 mmol/L (ref 22–32)
Calcium: 7.9 mg/dL — ABNORMAL LOW (ref 8.9–10.3)
Chloride: 104 mmol/L (ref 98–111)
Creatinine, Ser: 1.12 mg/dL — ABNORMAL HIGH (ref 0.44–1.00)
GFR calc Af Amer: >60 mL/min (ref >60)
GFR calc non Af Amer: 53 mL/min — ABNORMAL LOW (ref >60)
Glucose, Bld: 195 mg/dL — ABNORMAL HIGH (ref 70–99)
Phosphorus: 4.1 mg/dL (ref 2.5–4.6)
Potassium: 3.8 mmol/L (ref 3.5–5.1)
Sodium: 141 mmol/L (ref 135–145)

## 2020-05-16 LAB — CBC
HCT: 27.6 % — ABNORMAL LOW (ref 36.0–46.0)
Hemoglobin: 8.7 g/dL — ABNORMAL LOW (ref 12.0–15.0)
MCH: 28 pg (ref 26.0–34.0)
MCHC: 31.5 g/dL (ref 30.0–36.0)
MCV: 88.7 fL (ref 80.0–100.0)
Platelets: 81 10*3/uL — ABNORMAL LOW (ref 150–400)
RBC: 3.11 MIL/uL — ABNORMAL LOW (ref 3.87–5.11)
RDW: 18.5 % — ABNORMAL HIGH (ref 11.5–15.5)
WBC: 5.5 10*3/uL (ref 4.0–10.5)
nRBC: 0 % (ref 0.0–0.2)

## 2020-05-16 LAB — MAGNESIUM: Magnesium: 2.2 mg/dL (ref 1.7–2.4)

## 2020-05-16 LAB — VANCOMYCIN, TROUGH: Vancomycin Tr: 18 ug/mL (ref 15–20)

## 2020-05-16 NOTE — Progress Notes (Signed)
Upper extremity venous RT study completed.   Results relayed to RN.  See Cv Proc for preliminary results.   Katherine Hampton

## 2020-05-17 ENCOUNTER — Other Ambulatory Visit (HOSPITAL_COMMUNITY): Payer: Medicare Other

## 2020-05-17 LAB — RENAL FUNCTION PANEL
Albumin: 2.2 g/dL — ABNORMAL LOW (ref 3.5–5.0)
Anion gap: 12 (ref 5–15)
BUN: 61 mg/dL — ABNORMAL HIGH (ref 8–23)
CO2: 24 mmol/L (ref 22–32)
Calcium: 7.9 mg/dL — ABNORMAL LOW (ref 8.9–10.3)
Chloride: 102 mmol/L (ref 98–111)
Creatinine, Ser: 1.1 mg/dL — ABNORMAL HIGH (ref 0.44–1.00)
GFR calc Af Amer: >60 mL/min (ref >60)
GFR calc non Af Amer: 54 mL/min — ABNORMAL LOW (ref >60)
Glucose, Bld: 298 mg/dL — ABNORMAL HIGH (ref 70–99)
Phosphorus: 4.3 mg/dL (ref 2.5–4.6)
Potassium: 4.2 mmol/L (ref 3.5–5.1)
Sodium: 138 mmol/L (ref 135–145)

## 2020-05-17 LAB — CBC
HCT: 28.6 % — ABNORMAL LOW (ref 36.0–46.0)
Hemoglobin: 8.8 g/dL — ABNORMAL LOW (ref 12.0–15.0)
MCH: 27.2 pg (ref 26.0–34.0)
MCHC: 30.8 g/dL (ref 30.0–36.0)
MCV: 88.3 fL (ref 80.0–100.0)
Platelets: 105 10*3/uL — ABNORMAL LOW (ref 150–400)
RBC: 3.24 MIL/uL — ABNORMAL LOW (ref 3.87–5.11)
RDW: 17.8 % — ABNORMAL HIGH (ref 11.5–15.5)
WBC: 6.3 10*3/uL (ref 4.0–10.5)
nRBC: 0.3 % — ABNORMAL HIGH (ref 0.0–0.2)

## 2020-05-18 ENCOUNTER — Other Ambulatory Visit (HOSPITAL_COMMUNITY): Payer: Medicare Other

## 2020-05-18 LAB — CULTURE, RESPIRATORY W GRAM STAIN: Gram Stain: NONE SEEN

## 2020-05-20 LAB — CBC
HCT: 27 % — ABNORMAL LOW (ref 36.0–46.0)
Hemoglobin: 8.5 g/dL — ABNORMAL LOW (ref 12.0–15.0)
MCH: 27.9 pg (ref 26.0–34.0)
MCHC: 31.5 g/dL (ref 30.0–36.0)
MCV: 88.5 fL (ref 80.0–100.0)
Platelets: 122 10*3/uL — ABNORMAL LOW (ref 150–400)
RBC: 3.05 MIL/uL — ABNORMAL LOW (ref 3.87–5.11)
RDW: 17.4 % — ABNORMAL HIGH (ref 11.5–15.5)
WBC: 3.8 10*3/uL — ABNORMAL LOW (ref 4.0–10.5)
nRBC: 0 % (ref 0.0–0.2)

## 2020-05-20 LAB — BASIC METABOLIC PANEL
Anion gap: 8 (ref 5–15)
BUN: 56 mg/dL — ABNORMAL HIGH (ref 8–23)
CO2: 27 mmol/L (ref 22–32)
Calcium: 7.9 mg/dL — ABNORMAL LOW (ref 8.9–10.3)
Chloride: 104 mmol/L (ref 98–111)
Creatinine, Ser: 1 mg/dL (ref 0.44–1.00)
GFR calc Af Amer: >60 mL/min (ref >60)
GFR calc non Af Amer: >60 mL/min (ref >60)
Glucose, Bld: 137 mg/dL — ABNORMAL HIGH (ref 70–99)
Potassium: 4.3 mmol/L (ref 3.5–5.1)
Sodium: 139 mmol/L (ref 135–145)

## 2020-05-20 LAB — VANCOMYCIN, TROUGH: Vancomycin Tr: 17 ug/mL (ref 15–20)

## 2020-05-22 LAB — RENAL FUNCTION PANEL
Albumin: 2.1 g/dL — ABNORMAL LOW (ref 3.5–5.0)
Anion gap: 9 (ref 5–15)
BUN: 46 mg/dL — ABNORMAL HIGH (ref 8–23)
CO2: 25 mmol/L (ref 22–32)
Calcium: 8.2 mg/dL — ABNORMAL LOW (ref 8.9–10.3)
Chloride: 104 mmol/L (ref 98–111)
Creatinine, Ser: 0.94 mg/dL (ref 0.44–1.00)
GFR calc Af Amer: >60 mL/min (ref >60)
GFR calc non Af Amer: >60 mL/min (ref >60)
Glucose, Bld: 163 mg/dL — ABNORMAL HIGH (ref 70–99)
Phosphorus: 4.1 mg/dL (ref 2.5–4.6)
Potassium: 4.3 mmol/L (ref 3.5–5.1)
Sodium: 138 mmol/L (ref 135–145)

## 2020-05-22 LAB — CBC
HCT: 28.3 % — ABNORMAL LOW (ref 36.0–46.0)
Hemoglobin: 8.9 g/dL — ABNORMAL LOW (ref 12.0–15.0)
MCH: 27.6 pg (ref 26.0–34.0)
MCHC: 31.4 g/dL (ref 30.0–36.0)
MCV: 87.9 fL (ref 80.0–100.0)
Platelets: 148 10*3/uL — ABNORMAL LOW (ref 150–400)
RBC: 3.22 MIL/uL — ABNORMAL LOW (ref 3.87–5.11)
RDW: 17.2 % — ABNORMAL HIGH (ref 11.5–15.5)
WBC: 5 10*3/uL (ref 4.0–10.5)
nRBC: 0 % (ref 0.0–0.2)

## 2020-05-22 LAB — MAGNESIUM: Magnesium: 2 mg/dL (ref 1.7–2.4)

## 2020-05-24 LAB — CBC
HCT: 26.8 % — ABNORMAL LOW (ref 36.0–46.0)
Hemoglobin: 8.4 g/dL — ABNORMAL LOW (ref 12.0–15.0)
MCH: 27.8 pg (ref 26.0–34.0)
MCHC: 31.3 g/dL (ref 30.0–36.0)
MCV: 88.7 fL (ref 80.0–100.0)
Platelets: 125 10*3/uL — ABNORMAL LOW (ref 150–400)
RBC: 3.02 MIL/uL — ABNORMAL LOW (ref 3.87–5.11)
RDW: 17.6 % — ABNORMAL HIGH (ref 11.5–15.5)
WBC: 5.7 10*3/uL (ref 4.0–10.5)
nRBC: 0 % (ref 0.0–0.2)

## 2020-05-24 LAB — RENAL FUNCTION PANEL
Albumin: 1.8 g/dL — ABNORMAL LOW (ref 3.5–5.0)
Anion gap: 8 (ref 5–15)
BUN: 36 mg/dL — ABNORMAL HIGH (ref 8–23)
CO2: 25 mmol/L (ref 22–32)
Calcium: 7.8 mg/dL — ABNORMAL LOW (ref 8.9–10.3)
Chloride: 104 mmol/L (ref 98–111)
Creatinine, Ser: 0.97 mg/dL (ref 0.44–1.00)
GFR calc Af Amer: >60 mL/min (ref >60)
GFR calc non Af Amer: >60 mL/min (ref >60)
Glucose, Bld: 108 mg/dL — ABNORMAL HIGH (ref 70–99)
Phosphorus: 3.3 mg/dL (ref 2.5–4.6)
Potassium: 4 mmol/L (ref 3.5–5.1)
Sodium: 137 mmol/L (ref 135–145)

## 2020-05-24 LAB — MAGNESIUM: Magnesium: 1.7 mg/dL (ref 1.7–2.4)

## 2020-05-25 LAB — MAGNESIUM: Magnesium: 1.7 mg/dL (ref 1.7–2.4)

## 2020-05-26 LAB — CBC
HCT: 28.1 % — ABNORMAL LOW (ref 36.0–46.0)
Hemoglobin: 8.8 g/dL — ABNORMAL LOW (ref 12.0–15.0)
MCH: 28.1 pg (ref 26.0–34.0)
MCHC: 31.3 g/dL (ref 30.0–36.0)
MCV: 89.8 fL (ref 80.0–100.0)
Platelets: 115 10*3/uL — ABNORMAL LOW (ref 150–400)
RBC: 3.13 MIL/uL — ABNORMAL LOW (ref 3.87–5.11)
RDW: 17.7 % — ABNORMAL HIGH (ref 11.5–15.5)
WBC: 6 10*3/uL (ref 4.0–10.5)
nRBC: 0 % (ref 0.0–0.2)

## 2020-05-26 LAB — RENAL FUNCTION PANEL
Albumin: 1.8 g/dL — ABNORMAL LOW (ref 3.5–5.0)
Anion gap: 11 (ref 5–15)
BUN: 31 mg/dL — ABNORMAL HIGH (ref 8–23)
CO2: 21 mmol/L — ABNORMAL LOW (ref 22–32)
Calcium: 7.8 mg/dL — ABNORMAL LOW (ref 8.9–10.3)
Chloride: 106 mmol/L (ref 98–111)
Creatinine, Ser: 0.84 mg/dL (ref 0.44–1.00)
GFR calc Af Amer: >60 mL/min (ref >60)
GFR calc non Af Amer: >60 mL/min (ref >60)
Glucose, Bld: 119 mg/dL — ABNORMAL HIGH (ref 70–99)
Phosphorus: 3.8 mg/dL (ref 2.5–4.6)
Potassium: 4.4 mmol/L (ref 3.5–5.1)
Sodium: 138 mmol/L (ref 135–145)

## 2020-05-26 LAB — MAGNESIUM: Magnesium: 1.7 mg/dL (ref 1.7–2.4)

## 2020-05-26 LAB — SARS CORONAVIRUS 2 (TAT 6-24 HRS): SARS Coronavirus 2: NEGATIVE

## 2020-10-29 IMAGING — DX DG ABD PORTABLE 1V
1 series · 1 of 1 positions shown · non-contrast
Comparison: 05/05/2020

CLINICAL DATA: Nasogastric tube placement

EXAM:
PORTABLE ABDOMEN - 1 VIEW

[abdomen kub]
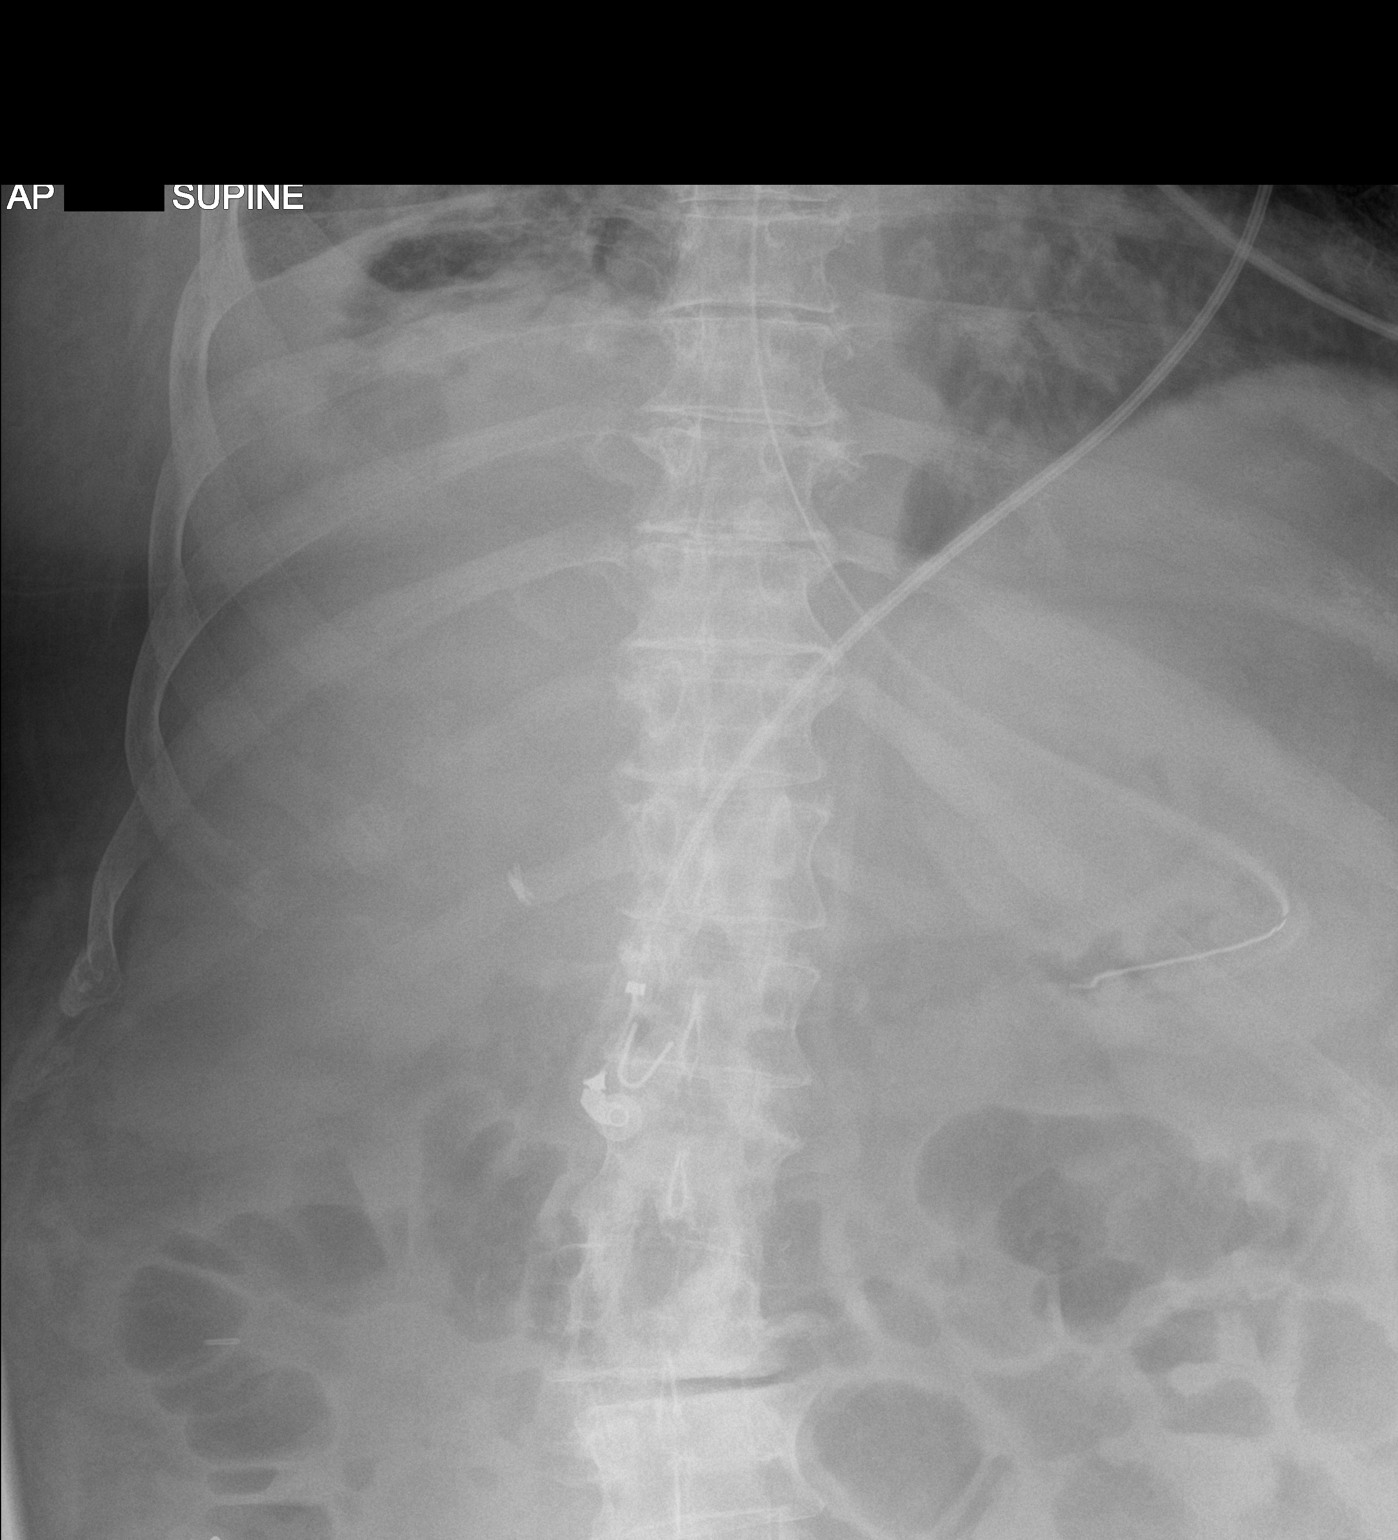

[1 of 1 positions shown; findings below may reference images not displayed]

FINDINGS: Nasogastric tube with tip at the mid stomach.

Pulmonary disease as seen on preceding chest x-ray. Limited coverage
of upper abdominal bowel loops. No overt obstructive pattern. No
concerning mass effect or gas collection.
IMPRESSION: Nasogastric tube with tip at the mid stomach.

## 2020-11-01 IMAGING — DX DG CHEST 1V PORT
1 series · 1 of 1 positions shown · non-contrast
Comparison: 05/15/2020

CLINICAL DATA: Follow-up pneumonia

EXAM:
PORTABLE CHEST 1 VIEW

[chest ap]
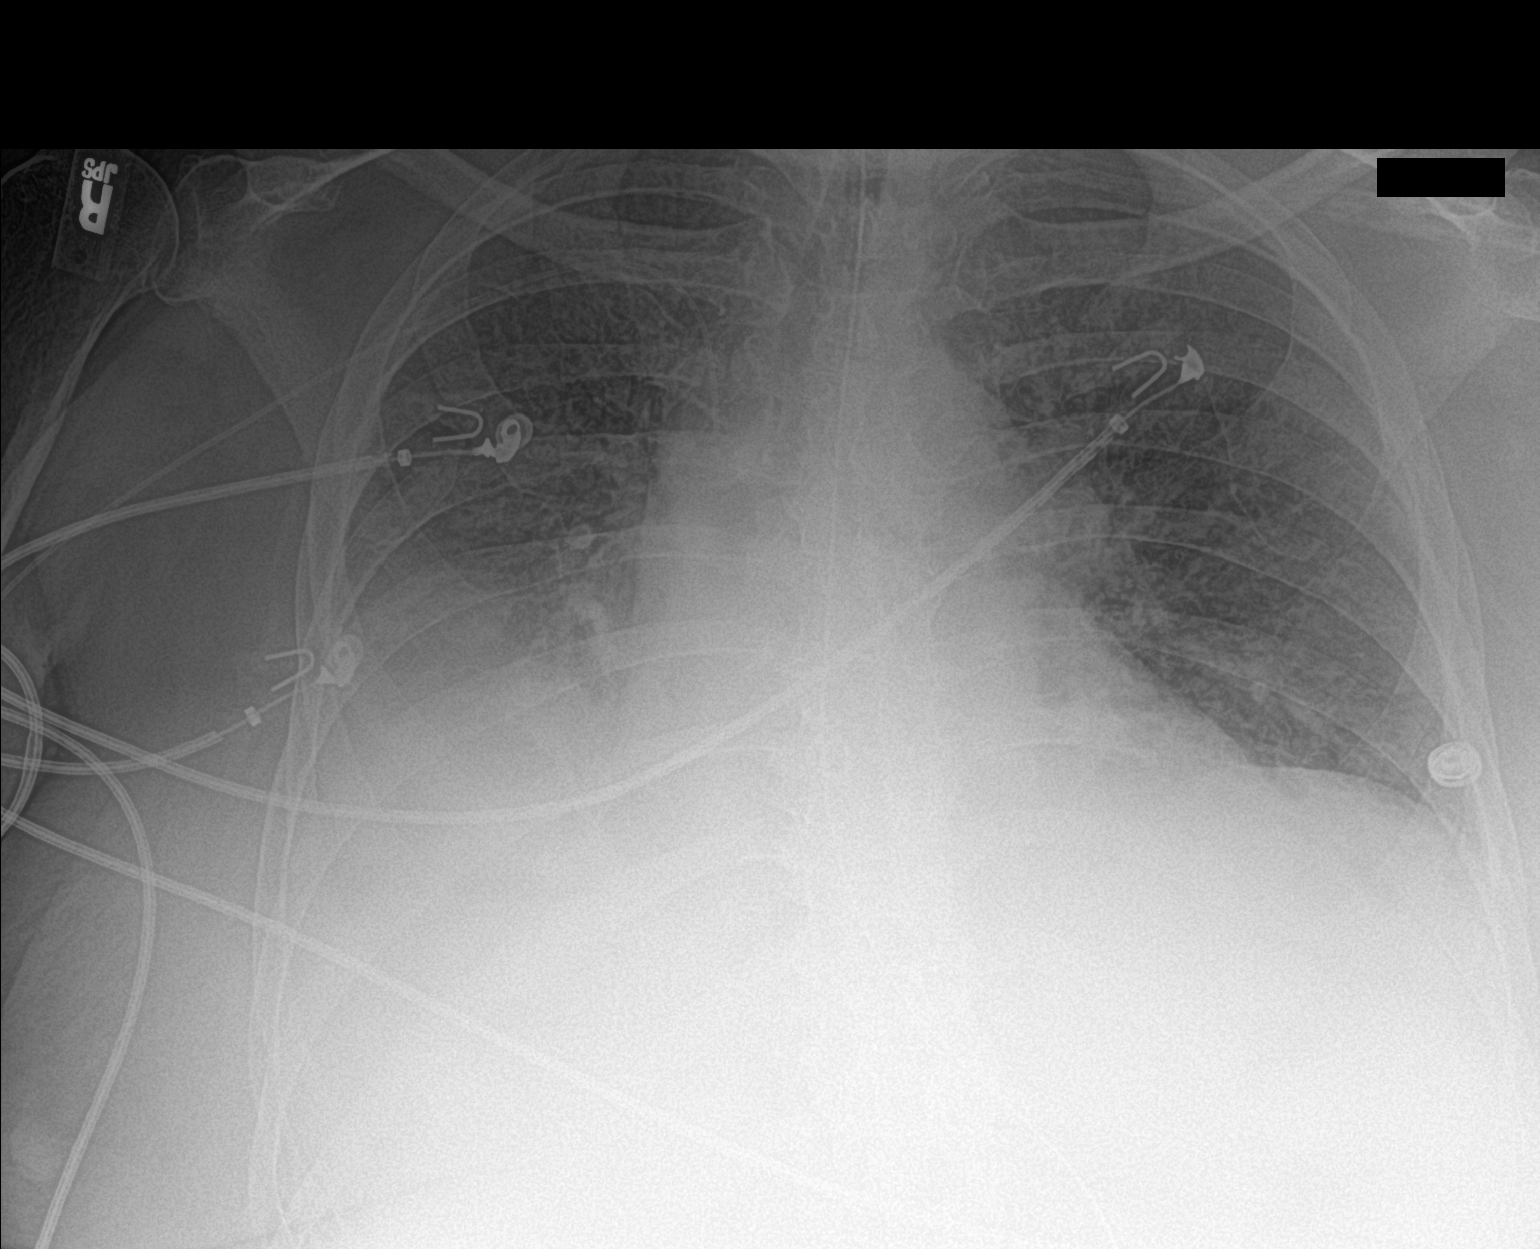

[1 of 1 positions shown; findings below may reference images not displayed]

FINDINGS: Nasogastric tube coursing below the diaphragm with the tip beyond
the field of view. Right-sided PICC line with the tip projecting
over the SVC.

Bilateral diffuse interstitial thickening and more focal right lower
lobe airspace disease. Possible trace right pleural effusion. No
pneumothorax. Stable cardiomediastinal silhouette.

No acute osseous abnormality.
IMPRESSION: Bilateral diffuse interstitial thickening and more focal right lower
lobe airspace disease. Differential considerations include pulmonary
edema versus multilobar pneumonia. No significant interval change
compared with 05/15/2020.
